# Patient Record
Sex: Male | Born: 1970 | Race: Black or African American | Hispanic: No | Marital: Single | Smoking: Current every day smoker
Health system: Southern US, Community
[De-identification: ages and names within clinical notes are randomized; demographics above are authoritative.]

---

## 2002-01-28 ENCOUNTER — Emergency Department (HOSPITAL_COMMUNITY): Admission: EM | Admit: 2002-01-28 | Discharge: 2002-01-28 | Payer: Self-pay | Admitting: Emergency Medicine

## 2002-05-16 ENCOUNTER — Emergency Department (HOSPITAL_COMMUNITY): Admission: EM | Admit: 2002-05-16 | Discharge: 2002-05-16 | Payer: Self-pay | Admitting: Emergency Medicine

## 2003-01-29 ENCOUNTER — Emergency Department (HOSPITAL_COMMUNITY): Admission: EM | Admit: 2003-01-29 | Discharge: 2003-01-29 | Payer: Self-pay | Admitting: Emergency Medicine

## 2003-05-06 ENCOUNTER — Emergency Department (HOSPITAL_COMMUNITY): Admission: EM | Admit: 2003-05-06 | Discharge: 2003-05-06 | Payer: Self-pay | Admitting: Emergency Medicine

## 2004-08-02 ENCOUNTER — Emergency Department (HOSPITAL_COMMUNITY): Admission: EM | Admit: 2004-08-02 | Discharge: 2004-08-02 | Payer: Self-pay | Admitting: *Deleted

## 2004-09-22 ENCOUNTER — Emergency Department (HOSPITAL_COMMUNITY): Admission: EM | Admit: 2004-09-22 | Discharge: 2004-09-22 | Payer: Self-pay | Admitting: Emergency Medicine

## 2004-09-25 ENCOUNTER — Emergency Department (HOSPITAL_COMMUNITY): Admission: EM | Admit: 2004-09-25 | Discharge: 2004-09-25 | Payer: Self-pay | Admitting: Emergency Medicine

## 2004-09-29 ENCOUNTER — Emergency Department (HOSPITAL_COMMUNITY): Admission: EM | Admit: 2004-09-29 | Discharge: 2004-09-29 | Payer: Self-pay | Admitting: Emergency Medicine

## 2004-10-04 ENCOUNTER — Emergency Department (HOSPITAL_COMMUNITY): Admission: EM | Admit: 2004-10-04 | Discharge: 2004-10-04 | Payer: Self-pay | Admitting: Emergency Medicine

## 2004-10-11 ENCOUNTER — Ambulatory Visit: Payer: Self-pay | Admitting: Internal Medicine

## 2004-10-12 ENCOUNTER — Inpatient Hospital Stay (HOSPITAL_COMMUNITY): Admission: EM | Admit: 2004-10-12 | Discharge: 2004-10-16 | Payer: Self-pay | Admitting: Emergency Medicine

## 2004-11-09 ENCOUNTER — Ambulatory Visit: Payer: Self-pay | Admitting: Internal Medicine

## 2004-12-02 ENCOUNTER — Ambulatory Visit: Payer: Self-pay | Admitting: Internal Medicine

## 2004-12-07 ENCOUNTER — Ambulatory Visit: Payer: Self-pay | Admitting: *Deleted

## 2007-12-07 ENCOUNTER — Ambulatory Visit: Payer: Self-pay | Admitting: Internal Medicine

## 2007-12-07 DIAGNOSIS — K089 Disorder of teeth and supporting structures, unspecified: Secondary | ICD-10-CM | POA: Insufficient documentation

## 2007-12-07 LAB — CONVERTED CEMR LAB
BUN: 11 mg/dL (ref 6–23)
CO2: 26 meq/L (ref 19–32)
Chloride: 108 meq/L (ref 96–112)
Glucose, Bld: 90 mg/dL (ref 70–99)
Potassium: 4.5 meq/L (ref 3.5–5.3)
Sodium: 142 meq/L (ref 135–145)

## 2008-04-11 ENCOUNTER — Ambulatory Visit: Payer: Self-pay | Admitting: Internal Medicine

## 2008-04-15 ENCOUNTER — Encounter (INDEPENDENT_AMBULATORY_CARE_PROVIDER_SITE_OTHER): Payer: Self-pay | Admitting: Internal Medicine

## 2008-06-10 ENCOUNTER — Emergency Department (HOSPITAL_COMMUNITY): Admission: EM | Admit: 2008-06-10 | Discharge: 2008-06-10 | Payer: Self-pay | Admitting: Emergency Medicine

## 2009-04-05 ENCOUNTER — Emergency Department (HOSPITAL_COMMUNITY): Admission: EM | Admit: 2009-04-05 | Discharge: 2009-04-06 | Payer: Self-pay | Admitting: Emergency Medicine

## 2009-04-10 ENCOUNTER — Encounter (INDEPENDENT_AMBULATORY_CARE_PROVIDER_SITE_OTHER): Payer: Self-pay | Admitting: Internal Medicine

## 2009-04-15 ENCOUNTER — Ambulatory Visit (HOSPITAL_COMMUNITY): Admission: RE | Admit: 2009-04-15 | Discharge: 2009-04-15 | Payer: Self-pay | Admitting: Otolaryngology

## 2009-09-15 ENCOUNTER — Emergency Department (HOSPITAL_COMMUNITY): Admission: EM | Admit: 2009-09-15 | Discharge: 2009-09-15 | Payer: Self-pay | Admitting: Emergency Medicine

## 2010-01-07 ENCOUNTER — Emergency Department (HOSPITAL_COMMUNITY): Admission: EM | Admit: 2010-01-07 | Discharge: 2010-01-07 | Payer: Self-pay | Admitting: Emergency Medicine

## 2010-03-04 ENCOUNTER — Ambulatory Visit: Payer: Self-pay | Admitting: Family Medicine

## 2010-03-04 LAB — CONVERTED CEMR LAB
Albumin: 4.8 g/dL (ref 3.5–5.2)
BUN: 13 mg/dL (ref 6–23)
Basophils Absolute: 0 10*3/uL (ref 0.0–0.1)
Calcium: 9.2 mg/dL (ref 8.4–10.5)
Chloride: 105 meq/L (ref 96–112)
Cholesterol: 209 mg/dL — ABNORMAL HIGH (ref 0–200)
Creatinine, Ser: 1.15 mg/dL (ref 0.40–1.50)
Eosinophils Relative: 1 % (ref 0–5)
Glucose, Bld: 92 mg/dL (ref 70–99)
HCT: 43.7 % (ref 39.0–52.0)
HCV Ab: NEGATIVE
HDL: 48 mg/dL (ref >39)
Hemoglobin: 14.3 g/dL (ref 13.0–17.0)
Hep A Total Ab: NEGATIVE
Lymphocytes Relative: 54 % — ABNORMAL HIGH (ref 12–46)
MCHC: 32.7 g/dL (ref 30.0–36.0)
Monocytes Absolute: 0.4 10*3/uL (ref 0.1–1.0)
Monocytes Relative: 8 % (ref 3–12)
Neutro Abs: 1.8 10*3/uL (ref 1.7–7.7)
Potassium: 3.9 meq/L (ref 3.5–5.3)
RBC: 4.44 M/uL (ref 4.22–5.81)
RDW: 13.5 % (ref 11.5–15.5)
Total CHOL/HDL Ratio: 4.4
Triglycerides: 162 mg/dL — ABNORMAL HIGH (ref <150)

## 2010-05-20 ENCOUNTER — Emergency Department (HOSPITAL_COMMUNITY): Admission: EM | Admit: 2010-05-20 | Discharge: 2010-05-20 | Payer: Self-pay | Admitting: Emergency Medicine

## 2010-06-13 ENCOUNTER — Emergency Department (HOSPITAL_COMMUNITY): Admission: EM | Admit: 2010-06-13 | Discharge: 2010-06-13 | Payer: Self-pay | Admitting: Emergency Medicine

## 2010-09-07 NOTE — Letter (Signed)
Summary: [facility]  [facility]   Imported By: Arta Bruce 03/25/2010 12:28:38  _____________________________________________________________________  External Attachment:    Type:   Image     Comment:   External Document

## 2010-10-05 ENCOUNTER — Emergency Department (HOSPITAL_COMMUNITY): Payer: Self-pay

## 2010-10-05 ENCOUNTER — Emergency Department (HOSPITAL_COMMUNITY)
Admission: EM | Admit: 2010-10-05 | Discharge: 2010-10-05 | Disposition: A | Discharge status: Home / Self Care | Payer: Self-pay | Attending: Emergency Medicine | Admitting: Emergency Medicine

## 2010-10-05 DIAGNOSIS — N509 Disorder of male genital organs, unspecified: Secondary | ICD-10-CM | POA: Insufficient documentation

## 2010-10-05 DIAGNOSIS — R1032 Left lower quadrant pain: Secondary | ICD-10-CM | POA: Insufficient documentation

## 2010-10-05 LAB — CBC
Hemoglobin: 14 g/dL (ref 13.0–17.0)
MCH: 32 pg (ref 26.0–34.0)
MCV: 96.8 fL (ref 78.0–100.0)
RBC: 4.37 MIL/uL (ref 4.22–5.81)

## 2010-10-05 LAB — DIFFERENTIAL
Eosinophils Absolute: 0 10*3/uL (ref 0.0–0.7)
Lymphs Abs: 2.9 10*3/uL (ref 0.7–4.0)
Monocytes Absolute: 0.3 10*3/uL (ref 0.1–1.0)
Monocytes Relative: 6 % (ref 3–12)
Neutro Abs: 2.2 10*3/uL (ref 1.7–7.7)
Neutrophils Relative %: 41 % — ABNORMAL LOW (ref 43–77)

## 2010-10-05 LAB — POCT I-STAT, CHEM 8
BUN: 8 mg/dL (ref 6–23)
Calcium, Ion: 1.06 mmol/L — ABNORMAL LOW (ref 1.12–1.32)
Chloride: 108 mEq/L (ref 96–112)
Creatinine, Ser: 1.1 mg/dL (ref 0.4–1.5)

## 2010-10-05 LAB — URINALYSIS, ROUTINE W REFLEX MICROSCOPIC
Hgb urine dipstick: NEGATIVE
Ketones, ur: 15 mg/dL — AB
Urine Glucose, Fasting: NEGATIVE mg/dL
pH: 7.5 (ref 5.0–8.0)

## 2010-11-12 LAB — CBC
HCT: 43 % (ref 39.0–52.0)
MCV: 100 fL (ref 78.0–100.0)
Platelets: 241 10*3/uL (ref 150–400)
RDW: 12.3 % (ref 11.5–15.5)

## 2010-11-13 LAB — BASIC METABOLIC PANEL
CO2: 28 mEq/L (ref 19–32)
Chloride: 104 mEq/L (ref 96–112)
GFR calc Af Amer: >60 mL/min (ref >60)
Potassium: 3.7 mEq/L (ref 3.5–5.1)
Sodium: 139 mEq/L (ref 135–145)

## 2010-11-13 LAB — DIFFERENTIAL
Eosinophils Absolute: 0 10*3/uL (ref 0.0–0.7)
Lymphocytes Relative: 15 % (ref 12–46)
Lymphs Abs: 1.6 10*3/uL (ref 0.7–4.0)
Neutro Abs: 8.9 10*3/uL — ABNORMAL HIGH (ref 1.7–7.7)
Neutrophils Relative %: 80 % — ABNORMAL HIGH (ref 43–77)

## 2010-11-13 LAB — PROTIME-INR
INR: 1.1 (ref 0.00–1.49)
Prothrombin Time: 13.6 seconds (ref 11.6–15.2)

## 2010-11-13 LAB — SAMPLE TO BLOOD BANK

## 2010-11-13 LAB — APTT: aPTT: 21 seconds — ABNORMAL LOW (ref 24–37)

## 2010-11-13 LAB — CBC
Platelets: 223 10*3/uL (ref 150–400)
WBC: 11.1 10*3/uL — ABNORMAL HIGH (ref 4.0–10.5)
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
## 2010-12-24 NOTE — Discharge Summary (Signed)
NAMEAUDEL, Brady Braun NO.:  [id number]   MEDICAL RECORD NO.:  [id number]          PATIENT TYPE:  INP   LOCATION:  5040                         FACILITY:  [facility]   PHYSICIAN:  Elliot Cousin, M.D.    DATE OF BIRTH:  [birth year]   DATE OF ADMISSION:  10/11/2004  DATE OF DISCHARGE:  10/16/2004                                 DISCHARGE SUMMARY   DISCHARGE DIAGNOSES:  1.  Large left posterolateral/foraminal cervical vertebrae-6/7 disc      protrusion with associated mass-effect. Small to slightly moderate-sized      broad-based right posterolateral cervical vertebrae-5/5 disc protrusion      per MRI of the cervical spine on February27,2006.  2.  Status post cervical vertebrae-6/7 anterior cervical decompression and      fusion with reflux hybrid plate per Dr. Trey Sailors on [phone].   DISCHARGE MEDICATIONS:  Percocet 1-2 tablets every 6 hours as needed for  pain.   DISCHARGE DISPOSITION:  The patient was discharged to home in improved and  stable condition on March11,2006. He was advised to follow up with Dr. Channing Mutters  in two weeks for hospital follow-up.   HISTORY OF PRESENT ILLNESS:  The patient is a 40 year old man with no  significant past medical history who presented to the emergency department  with a chief complaint of worsening pain in his left arm, left neck and  chest. The pain had been accompanied by worsening numbness, tingling and  weakness in his left arm. The patient had a several month history of pain in  his left arm, neck and chest. He was evaluated in the emergency department  on multiple occasions. Finally on February27,2006, when the patient  presented to the emergency department again, a MRI of the cervical spine was  ordered. The MRI revealed a large left posterior C6-C7 disc protrusion and a  small right C5-C6 protrusion. The patient was referred to a neurosurgeon as  an outpatient from the emergency department. However, when he presented to  the [facility], the physician at the [facility]  referred the patient to the emergency department for further evaluation and  management of severe pain in his left arm, neck and chest.   HOSPITAL COURSE:  The patient was started  a morphine PCA pump for pain  control. There was a concern regarding the area of tenderness and edema over  the left chest wall. A CT scan of the chest was ordered to rule out a mass.  The CT scan of the chest which was performed on March7,2006 was negative.  The CT scan of the chest was completely normal. Dr. Trey Sailors, neurosurgeon,  was consulted for further evaluation and management. Per Dr. Loraine Leriche Roy's  assessment, the patient would benefit from an anterior cervical discectomy  and fusion. Prior to the operation, the patient had an episode when his legs  gave out when he was walking. The patient fell to the floor however, his  fall was braced by his sister. The patient had no loss of consciousness.  There was no trauma to his  neck or his head. The patient had been advised  previously to not leave the floor ambulating independently. However, he  attempted to go to the cafeteria, and when he did, his legs gave way. The  patient was returned to his room in a stretcher with no further episodes.   On March10,2006, the patient was taken to the operating room where he  underwent a C6-C7 anterior cervical decompression and fusion per Dr. Trey Sailors. The patient tolerated the procedure well. A follow-up x-ray of the  cervical spine revealed an anterior cervical fusion at C6-C7 level with  normal alignment to the C7 level.   The patient was discharged to home by the neurosurgery team on March11,2006  in no acute distress.   DISCHARGE LABORATORY DATA:  Sodium 137, potassium 4.1, chloride 112, CO2 23,  glucose 92, BUN 9, creatinine 1.0, total bilirubin 0.7, alkaline phosphatase  42, AST 18, ALT 25, total protein 6.4, albumin 3.7, calcium 8.5. WBC  4.8,  hemoglobin 14.3, hematocrit 42.9, platelets 303,000 (these labs were drawn  on March9,2006).      DF/MEDQ  D:  10/20/2004  T:  10/20/2004  Job:  [id number]   cc:   Payton Doughty, M.D.  [address]  [place]  [place] [address]  Fax: [phone]

## 2010-12-24 NOTE — Op Note (Signed)
Brady Braun, Brady Braun NO.:  [id number]   MEDICAL RECORD NO.:  [id number]          PATIENT TYPE:  INP   LOCATION:                               FACILITY:  [facility]   PHYSICIAN:  Payton Doughty, M.D.      DATE OF BIRTH:  Jul 22, 1971   DATE OF PROCEDURE:  10/15/2004  DATE OF DISCHARGE:                                 OPERATIVE REPORT   PREOPERATIVE DIAGNOSIS:  Herniated disk at C6-7 on the left.   POSTOPERATIVE DIAGNOSIS:  Herniated disk at C6-7 on the left.   OPERATIVE PROCEDURE:  C6-7 anterior cervical decompression and fusion with  reflux hybrid plate.   SURGEON:  Payton Doughty, M.D.   ANESTHESIA:  General endotracheal.   PREPARATION:  Prepped with standard prep and scrubbed with alcohol wipe.   COMPLICATIONS:  None.   INDICATIONS:  This is a 40 year old, right-handed, black gentleman with a  herniated disk at C6-7 on the left side.   DESCRIPTION OF PROCEDURE:  He was taken to the operating room, smoothly  anesthetized, intubated and placed supine on the operating table in the  halter head traction with the neck extended.  Following shave, prep and  drape in the usual sterile fashion, the skin was incised in the midline to  the medial border of the sternocleidomastoid muscle on the left side.  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  The  sternocleidomastoid was identified and medial dissection revealed the  carotid artery tracked laterally to the left and the trachea and esophagus  retracted laterally to the right.  Exposing the bones of the anterior  cervical spine, markers were placed.  Intraoperative x-ray obtained to  confirm the correctness of the level.  Having confirmed correctness of the  level, diskectomy was carried out at C6-7 under gross observation.  The  operating microscope was then brought in and we used the microdissection  technique to remove the remaining disk and dissect the anterior epidural  space, which was found  to be full of herniated disk that extended out into  the left C7 neural foramen.  This was removed without difficulty.  End  plates were curetted.  The nerve roots were carefully explored and found to  be open.  A 7 mm bone graft was fashioned and patellar allograft tapped into  place.  The 14 mm reflex hybrid plate was then placed with two screws in C6  and two in C7.  Intraoperative x-rays showed good placement of bone graft,  plate and screws.  The wound was irrigated and hemostasis was ensured.  The  platysma was reapproximated with 3-0 Vicryl in interrupted fashion.  The  subcutaneous tissues were reapproximated with 3-0 Vicryl interrupted  fashion.  The skin was closed with 4-0 Vicryl in a running subcuticular  fashion.  Benzoin and Steri-Strips were placed and made occlusive with talc  and Op-Site.  Then the patient returned to the recovery room in good  condition.      ___________________________________________  Payton Doughty, M.D.   MWR/MEDQ  D:  10/15/2004  T:  10/16/2004  Job:  [id number]

## 2011-05-10 LAB — POCT I-STAT, CHEM 8
BUN: 8
Calcium, Ion: 1.24
TCO2: 28

## 2011-07-01 ENCOUNTER — Emergency Department (HOSPITAL_COMMUNITY)
Admission: EM | Admit: 2011-07-01 | Discharge: 2011-07-01 | Disposition: A | Discharge status: Home / Self Care | Payer: Self-pay | Attending: Emergency Medicine | Admitting: Emergency Medicine

## 2011-07-01 ENCOUNTER — Emergency Department (HOSPITAL_COMMUNITY): Payer: Self-pay

## 2011-07-01 DIAGNOSIS — S61209A Unspecified open wound of unspecified finger without damage to nail, initial encounter: Secondary | ICD-10-CM | POA: Insufficient documentation

## 2011-07-01 DIAGNOSIS — W260XXA Contact with knife, initial encounter: Secondary | ICD-10-CM | POA: Insufficient documentation

## 2011-07-01 DIAGNOSIS — IMO0002 Reserved for concepts with insufficient information to code with codable children: Secondary | ICD-10-CM

## 2011-07-01 DIAGNOSIS — T148XXA Other injury of unspecified body region, initial encounter: Secondary | ICD-10-CM

## 2011-07-01 MED ORDER — OXYCODONE-ACETAMINOPHEN 5-325 MG PO TABS
2.0000 | ORAL_TABLET | Freq: Once | ORAL | Status: AC
  Administered 2011-07-01: 2 via ORAL
  Filled 2011-07-01: qty 2

## 2011-07-01 MED ORDER — BUPIVACAINE HCL (PF) 0.25 % IJ SOLN
INTRAMUSCULAR | Status: AC
  Filled 2011-07-01: qty 30

## 2011-07-01 MED ORDER — HYDROCODONE-ACETAMINOPHEN 5-500 MG PO TABS: 1.0000 | ORAL_TABLET | Freq: Four times a day (QID) | ORAL | Status: AC | PRN

## 2011-07-01 MED ORDER — BUPIVACAINE HCL (PF) 0.5 % IJ SOLN
INTRAMUSCULAR | Status: AC
  Administered 2011-07-01: 12:00:00
  Filled 2011-07-01: qty 30

## 2011-07-01 NOTE — ED Provider Notes (Signed)
History     CSN: [id number] Arrival date & time: 07/01/2011 10:42 AM   None     Chief Complaint  Patient presents with  . Laceration    (Consider location/radiation/quality/duration/timing/severity/associated sxs/prior treatment) HPI  Patient presents to emergency department complaining of right thumb injury just prior to arrival when he states he sliced the tip of his right thumb on a sharp meet cutter. Patient states she's been controlling the bleeding with direct pressure. He denies thumn numbness or tingling but states constant pain since onset of laceration. Has not taken anything for pain prior to arrival. Patient states his last tetanus was within the last 2-3 years. Denies pain in proxiamal thumb or hand. He denies additional injury. Patient has no known medical problems and takes no medicines on a regular basis. Pain is aggravated by touch of the distal tip of thumb.  No past medical history on file.  No past surgical history on file.  No family history on file.  History  Substance Use Topics  . Smoking status: Not on file  . Smokeless tobacco: Not on file  . Alcohol Use: Not on file      Review of Systems  All other systems reviewed and are negative.    Allergies  Review of patient's allergies indicates no known allergies.  Home Medications  No current outpatient prescriptions on file.  BP 106/74  Pulse 79  Temp(Src) 98.6 F (37 C) (Oral)  Resp 18  Ht 5\' 6"  (1.676 m)  Wt 130 lb (58.968 kg)  BMI 20.98 kg/m2  SpO2 100%  Physical Exam  Nursing note and vitals reviewed. Constitutional: He is oriented to person, place, and time. He appears well-developed and well-nourished.  HENT:  Head: Normocephalic and atraumatic.  Eyes: Conjunctivae are normal.  Neck: Normal range of motion.  Cardiovascular: Normal rate.   Pulmonary/Chest: Effort normal.  Musculoskeletal: Normal range of motion. He exhibits no edema.       Right shoulder: He exhibits  laceration.       Right hand: He exhibits tenderness and laceration. He exhibits no bony tenderness and normal capillary refill. normal sensation noted. Normal strength noted.       Hands:      Soft tissue avulation/injury of lateral edge of right distal lateral thumb and lateral edge of thumbnail but No obvious bone injury. Full range of motion of thumb against resistance with mild pain and distal tip. No tenderness to palpation of distal and proximal joints of the thumb  Neurological: He is alert and oriented to person, place, and time.    ED Course  NERVE BLOCK Date/Time: 07/01/2011 12:11 PM Performed by: Jenness Corner Authorized by: Jenness Corner Consent: Verbal consent obtained. Written consent not obtained. Risks and benefits: risks, benefits and alternatives were discussed Consent given by: patient Patient understanding: patient states understanding of the procedure being performed Patient consent: the patient's understanding of the procedure matches consent given Patient identity confirmed: verbally with patient and arm band Time out: Immediately prior to procedure a "time out" was called to verify the correct patient, procedure, equipment, support staff and site/side marked as required. Indications: pain relief Nerve block body site: right thumb. Preparation: Patient was prepped and draped in the usual sterile fashion. Local anesthetic: bupivacaine 0.5% without epinephrine Anesthetic total: 6 ml Outcome: pain improved Patient tolerance: Patient tolerated the procedure well with no immediate complications.   (including critical care time)  PO percocet  Labs Reviewed - No data to display  Dg Finger Thumb Right  07/01/2011  *RADIOLOGY REPORT*  Clinical Data: Right thumb injury question of prior fracture  RIGHT THUMB 2+V  Comparison: None.  Findings: Three views of the right thumb submitted.  Study is limited by bandage artifact.  No acute fracture or subluxation. Soft  tissue irregularity noted at the tip of the thumb bowel.  IMPRESSION: No acute fracture or subluxation.  Bandage artifacts are noted. Soft tissue injury at the tip of the thumb .  Original Report Authenticated By: Natasha Mead, M.D.     1. Soft tissue avulsion   2. Laceration       MDM  Soft tissue laceration/avulsion of right distal thumb and fingernail but no bone involvement. No obvious nail bed involvement. Good cap refill and sensation. No tenderness to palpation of distal or proximal joints. No other injury noted. Patient agreeable to following up with hand surgery for further evaluation        Jenness Corner, PA 07/01/11 [phone]

## 2011-07-01 NOTE — ED Notes (Signed)
Patient denies pain and is resting comfortably.  Wound care completed . Supplies and instructions giiven

## 2011-07-01 NOTE — ED Provider Notes (Signed)
Evaluation and management procedures were performed by the mid-level provider (PA/NP/CNM) under my supervision/collaboration. I was present and available during the ED course. Megen Madewell Y.   Bennet Kujawa Y. Anirudh Baiz, MD 07/01/11 1522 
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
## 2011-07-01 NOTE — ED Notes (Signed)
Pt states he had t-dap 2years ago

## 2011-11-16 ENCOUNTER — Ambulatory Visit (HOSPITAL_COMMUNITY)
Admission: EM | Admit: 2011-11-16 | Discharge: 2011-11-17 | Disposition: A | Discharge status: Home / Self Care | Payer: Self-pay | Attending: Emergency Medicine | Admitting: Emergency Medicine

## 2011-11-16 ENCOUNTER — Inpatient Hospital Stay: Admit: 2011-11-16 | Payer: Self-pay | Admitting: Urology

## 2011-11-16 ENCOUNTER — Encounter (HOSPITAL_COMMUNITY)
Admission: EM | Disposition: A | Discharge status: Home / Self Care | Payer: Self-pay | Source: Home / Self Care | Attending: Emergency Medicine

## 2011-11-16 ENCOUNTER — Encounter (HOSPITAL_COMMUNITY): Payer: Self-pay | Admitting: Anesthesiology

## 2011-11-16 ENCOUNTER — Emergency Department (HOSPITAL_COMMUNITY): Payer: Self-pay | Admitting: Anesthesiology

## 2011-11-16 ENCOUNTER — Encounter (HOSPITAL_COMMUNITY): Payer: Self-pay | Admitting: *Deleted

## 2011-11-16 ENCOUNTER — Other Ambulatory Visit: Payer: Self-pay | Admitting: Urology

## 2011-11-16 DIAGNOSIS — IMO0002 Reserved for concepts with insufficient information to code with codable children: Secondary | ICD-10-CM | POA: Insufficient documentation

## 2011-11-16 DIAGNOSIS — F17200 Nicotine dependence, unspecified, uncomplicated: Secondary | ICD-10-CM | POA: Insufficient documentation

## 2011-11-16 DIAGNOSIS — S39840A Fracture of corpus cavernosum penis, initial encounter: Secondary | ICD-10-CM | POA: Insufficient documentation

## 2011-11-16 DIAGNOSIS — Y9389 Activity, other specified: Secondary | ICD-10-CM | POA: Insufficient documentation

## 2011-11-16 HISTORY — PX: CYSTOSCOPY: SHX5120

## 2011-11-16 LAB — CBC
MCH: 32.9 pg (ref 26.0–34.0)
MCHC: 34.3 g/dL (ref 30.0–36.0)
Platelets: 215 10*3/uL (ref 150–400)
RBC: 4.07 MIL/uL — ABNORMAL LOW (ref 4.22–5.81)

## 2011-11-16 LAB — BASIC METABOLIC PANEL
Calcium: 8.9 mg/dL (ref 8.4–10.5)
GFR calc non Af Amer: >90 mL/min (ref >90)
Glucose, Bld: 96 mg/dL (ref 70–99)
Sodium: 145 mEq/L (ref 135–145)

## 2011-11-16 LAB — DIFFERENTIAL
Basophils Relative: 1 % (ref 0–1)
Eosinophils Absolute: 0 10*3/uL (ref 0.0–0.7)
Lymphs Abs: 2.8 10*3/uL (ref 0.7–4.0)
Neutrophils Relative %: 45 % (ref 43–77)

## 2011-11-16 SURGERY — CYSTOSCOPY, FLEXIBLE
Anesthesia: General | Site: Urethra | Wound class: Clean Contaminated

## 2011-11-16 MED ORDER — BUPIVACAINE-EPINEPHRINE PF 0.25-1:200000 % IJ SOLN
INTRAMUSCULAR | Status: AC
  Filled 2011-11-16: qty 30

## 2011-11-16 MED ORDER — HYDROMORPHONE HCL PF 1 MG/ML IJ SOLN
0.5000 mg | INTRAMUSCULAR | Status: DC | PRN
  Administered 2011-11-16 – 2011-11-17 (×6): 1 mg via INTRAVENOUS
  Filled 2011-11-16 (×6): qty 1

## 2011-11-16 MED ORDER — HYDROCODONE-ACETAMINOPHEN 5-325 MG PO TABS
1.0000 | ORAL_TABLET | ORAL | Status: DC | PRN
  Administered 2011-11-17 (×2): 2 via ORAL
  Filled 2011-11-16 (×2): qty 2

## 2011-11-16 MED ORDER — MIDAZOLAM HCL 5 MG/5ML IJ SOLN
INTRAMUSCULAR | Status: DC | PRN
  Administered 2011-11-16: 2 mg via INTRAVENOUS

## 2011-11-16 MED ORDER — DOCUSATE SODIUM 100 MG PO CAPS
100.0000 mg | ORAL_CAPSULE | Freq: Two times a day (BID) | ORAL | Status: DC
  Administered 2011-11-16 – 2011-11-17 (×2): 100 mg via ORAL
  Filled 2011-11-16 (×3): qty 1

## 2011-11-16 MED ORDER — HYDROMORPHONE HCL PF 1 MG/ML IJ SOLN
INTRAMUSCULAR | Status: AC
  Filled 2011-11-16: qty 1

## 2011-11-16 MED ORDER — ONDANSETRON HCL 4 MG/2ML IJ SOLN
INTRAMUSCULAR | Status: DC | PRN
  Administered 2011-11-16: 4 mg via INTRAVENOUS

## 2011-11-16 MED ORDER — CEFAZOLIN SODIUM 1-5 GM-% IV SOLN
1.0000 g | INTRAVENOUS | Status: AC
  Administered 2011-11-16: 1 g via INTRAVENOUS
  Filled 2011-11-16: qty 50

## 2011-11-16 MED ORDER — LACTATED RINGERS IV SOLN
INTRAVENOUS | Status: DC | PRN
  Administered 2011-11-16: 16:00:00 via INTRAVENOUS

## 2011-11-16 MED ORDER — CEFAZOLIN SODIUM 1-5 GM-% IV SOLN
1.0000 g | Freq: Three times a day (TID) | INTRAVENOUS | Status: AC
  Administered 2011-11-16 – 2011-11-17 (×2): 1 g via INTRAVENOUS
  Filled 2011-11-16 (×2): qty 50

## 2011-11-16 MED ORDER — SODIUM CHLORIDE 0.9 % IV SOLN: Freq: Once | INTRAVENOUS | Status: DC

## 2011-11-16 MED ORDER — HYDROMORPHONE HCL PF 1 MG/ML IJ SOLN
0.2500 mg | INTRAMUSCULAR | Status: DC | PRN
Start: 2011-11-16 — End: 2011-11-16
  Administered 2011-11-16 (×4): 0.5 mg via INTRAVENOUS

## 2011-11-16 MED ORDER — ONDANSETRON HCL 4 MG/2ML IJ SOLN
4.0000 mg | INTRAMUSCULAR | Status: DC | PRN
  Administered 2011-11-16: 4 mg via INTRAVENOUS
  Filled 2011-11-16: qty 2

## 2011-11-16 MED ORDER — LACTATED RINGERS IV SOLN: INTRAVENOUS | Status: DC

## 2011-11-16 MED ORDER — LIDOCAINE HCL (CARDIAC) 20 MG/ML IV SOLN
INTRAVENOUS | Status: DC | PRN
  Administered 2011-11-16: 50 mg via INTRAVENOUS

## 2011-11-16 MED ORDER — CEFAZOLIN SODIUM 1-5 GM-% IV SOLN
INTRAVENOUS | Status: AC
  Filled 2011-11-16: qty 50

## 2011-11-16 MED ORDER — MEPERIDINE HCL 50 MG/ML IJ SOLN
INTRAMUSCULAR | Status: AC
  Filled 2011-11-16: qty 1

## 2011-11-16 MED ORDER — SODIUM CHLORIDE 0.9 % IJ SOLN
INTRAMUSCULAR | Status: DC | PRN
  Administered 2011-11-16: 30 mL

## 2011-11-16 MED ORDER — MORPHINE SULFATE 2 MG/ML IJ SOLN
INTRAMUSCULAR | Status: AC
  Administered 2011-11-16: 2 mg via INTRAVENOUS
  Filled 2011-11-16: qty 1

## 2011-11-16 MED ORDER — ACETAMINOPHEN 10 MG/ML IV SOLN
INTRAVENOUS | Status: DC | PRN
  Administered 2011-11-16: 1000 mg via INTRAVENOUS

## 2011-11-16 MED ORDER — LACTATED RINGERS IV SOLN
INTRAVENOUS | Status: DC
  Administered 2011-11-16: 18:00:00 via INTRAVENOUS

## 2011-11-16 MED ORDER — OXYCODONE-ACETAMINOPHEN 5-325 MG PO TABS
2.0000 | ORAL_TABLET | Freq: Once | ORAL | Status: AC
  Administered 2011-11-16: 2 via ORAL
  Filled 2011-11-16: qty 2

## 2011-11-16 MED ORDER — BACITRACIN-NEOMYCIN-POLYMYXIN 400-5-5000 EX OINT: 1.0000 "application " | TOPICAL_OINTMENT | Freq: Three times a day (TID) | CUTANEOUS | Status: DC | PRN

## 2011-11-16 MED ORDER — PROMETHAZINE HCL 25 MG/ML IJ SOLN: 6.2500 mg | INTRAMUSCULAR | Status: DC | PRN

## 2011-11-16 MED ORDER — STERILE WATER FOR IRRIGATION IR SOLN
Status: DC | PRN
  Administered 2011-11-16: 3000 mL via INTRAVESICAL

## 2011-11-16 MED ORDER — FENTANYL CITRATE 0.05 MG/ML IJ SOLN
INTRAMUSCULAR | Status: DC | PRN
  Administered 2011-11-16 (×2): 25 ug via INTRAVENOUS
  Administered 2011-11-16: 100 ug via INTRAVENOUS
  Administered 2011-11-16 (×2): 50 ug via INTRAVENOUS

## 2011-11-16 MED ORDER — ACETAMINOPHEN 325 MG PO TABS: 650.0000 mg | ORAL_TABLET | ORAL | Status: DC | PRN

## 2011-11-16 MED ORDER — MEPERIDINE HCL 50 MG/ML IJ SOLN: 6.2500 mg | INTRAMUSCULAR | Status: DC | PRN

## 2011-11-16 MED ORDER — ACETAMINOPHEN 10 MG/ML IV SOLN
INTRAVENOUS | Status: AC
  Filled 2011-11-16: qty 100

## 2011-11-16 MED ORDER — 0.9 % SODIUM CHLORIDE (POUR BTL) OPTIME
TOPICAL | Status: DC | PRN
  Administered 2011-11-16: 1000 mL

## 2011-11-16 MED ORDER — PROPOFOL 10 MG/ML IV BOLUS
INTRAVENOUS | Status: DC | PRN
  Administered 2011-11-16: 200 mg via INTRAVENOUS

## 2011-11-16 MED ORDER — MORPHINE SULFATE 2 MG/ML IJ SOLN
2.0000 mg | INTRAMUSCULAR | Status: DC | PRN
  Administered 2011-11-16: 2 mg via INTRAVENOUS

## 2011-11-16 MED ORDER — ZOLPIDEM TARTRATE 5 MG PO TABS: 5.0000 mg | ORAL_TABLET | Freq: Every evening | ORAL | Status: DC | PRN

## 2011-11-16 SURGICAL SUPPLY — 38 items
BAG URINE LEG 500ML (DRAIN) ×1 IMPLANT
BAG URO CATCHER STRL LF (DRAPE) ×2 IMPLANT
BANDAGE CONFORM 2  STR LF (GAUZE/BANDAGES/DRESSINGS) ×1 IMPLANT
BLADE SURG 15 STRL LF DISP TIS (BLADE) ×2 IMPLANT
BLADE SURG 15 STRL SS (BLADE) ×3
BNDG COHESIVE 1X5 TAN STRL LF (GAUZE/BANDAGES/DRESSINGS) ×3 IMPLANT
CATH FOLEY 2WAY SLVR  5CC 16FR (CATHETERS) ×1
CATH FOLEY 2WAY SLVR 5CC 16FR (CATHETERS) IMPLANT
CLOTH BEACON ORANGE TIMEOUT ST (SAFETY) ×3 IMPLANT
COVER SURGICAL LIGHT HANDLE (MISCELLANEOUS) ×3 IMPLANT
DRAIN PENROSE 18X1/4 LTX STRL (WOUND CARE) ×1 IMPLANT
DRAPE CAMERA CLOSED 9X96 (DRAPES) ×3 IMPLANT
DRAPE LAPAROSCOPIC ABDOMINAL (DRAPES) ×1 IMPLANT
ELECT REM PT RETURN 9FT ADLT (ELECTROSURGICAL) ×3
ELECTRODE REM PT RTRN 9FT ADLT (ELECTROSURGICAL) ×2 IMPLANT
GAUZE VASELINE 1X8 (GAUZE/BANDAGES/DRESSINGS) ×3 IMPLANT
GAUZE XEROFORM 1X8 LF (GAUZE/BANDAGES/DRESSINGS) ×1 IMPLANT
GLOVE BIOGEL M STRL SZ7.5 (GLOVE) ×3 IMPLANT
GOWN PREVENTION PLUS XLARGE (GOWN DISPOSABLE) ×3 IMPLANT
GOWN STRL NON-REIN LRG LVL3 (GOWN DISPOSABLE) ×3 IMPLANT
GOWN STRL REIN XL XLG (GOWN DISPOSABLE) ×3 IMPLANT
IV SET HUBERPLUS 22X1 SAFETY (NEEDLE) ×1 IMPLANT
KIT BASIN OR (CUSTOM PROCEDURE TRAY) ×3 IMPLANT
MANIFOLD NEPTUNE II (INSTRUMENTS) ×2 IMPLANT
NS IRRIG 1000ML POUR BTL (IV SOLUTION) IMPLANT
PACK BASIC VI WITH GOWN DISP (CUSTOM PROCEDURE TRAY) ×2 IMPLANT
PACK CYSTO (CUSTOM PROCEDURE TRAY) ×2 IMPLANT
PACK GENERAL/GYN (CUSTOM PROCEDURE TRAY) ×1 IMPLANT
PENCIL BUTTON HOLSTER BLD 10FT (ELECTRODE) ×3 IMPLANT
SET IRRIG Y TYPE TUR BLADDER L (SET/KITS/TRAYS/PACK) ×1 IMPLANT
SPONGE LAP 18X18 X RAY DECT (DISPOSABLE) ×3 IMPLANT
SUT CHROMIC 3 0 PS 2 (SUTURE) IMPLANT
SUT CHROMIC 3 0 SH 27 (SUTURE) ×7 IMPLANT
SUT SILK 2 0 (SUTURE)
SUT SILK 2-0 18XBRD TIE 12 (SUTURE) IMPLANT
SYR 20CC LL (SYRINGE) ×1 IMPLANT
SYR CONTROL 10ML LL (SYRINGE) IMPLANT
TAPE CLOTH SOFT 2X10 (GAUZE/BANDAGES/DRESSINGS) ×1 IMPLANT

## 2011-11-16 NOTE — Brief Op Note (Signed)
11/16/2011  6:57 PM  PATIENT:  Brady Braun  41 y.o. male  PRE-OPERATIVE DIAGNOSIS:  penile fracture, weak stream  POST-OPERATIVE DIAGNOSIS:  penile fracture  PROCEDURE:  Procedure(s) (LRB): CYSTOSCOPY FLEXIBLE (N/A) REPAIR OF FRACTURED PENIS (N/A)  SURGEON:  Surgeon(s) and Role:    * Antony Haste, MD - Primary  ANESTHESIA:   general  FINDINGS: 1 - 1.5 CM tear in the right proximal inferior corpora extending from lateral to medial and adjacent to the urethra.    EBL: 30 ML  BLOOD ADMINISTERED:none  DRAINS: Urinary Catheter (Foley)   LOCAL MEDICATIONS USED:  NONE  SPECIMEN:  No Specimen  DISPOSITION OF SPECIMEN:  N/A  COUNTS:  YES  DICTATION: 161096  PLAN OF CARE: Admit for overnight observation  PATIENT DISPOSITION:  PACU - hemodynamically stable.   Delay start of Pharmacological VTE agent (>24hrs) due to surgical blood loss or risk of bleeding: not applicable

## 2011-11-16 NOTE — Transfer of Care (Signed)
Immediate Anesthesia Transfer of Care Note  Patient: Brady Braun  Procedure(s) Performed: Procedure(s) (LRB): CYSTOSCOPY FLEXIBLE (N/A) REPAIR OF FRACTURED PENIS (N/A)  Patient Location: PACU  Anesthesia Type: General  Level of Consciousness: awake, alert  and oriented  Airway & Oxygen Therapy: Patient Spontanous Breathing and Patient connected to face mask oxygen  Post-op Assessment: Report given to PACU RN and Post -op Vital signs reviewed and stable  Post vital signs: Reviewed and stable  Complications: No apparent anesthesia complications

## 2011-11-16 NOTE — ED Notes (Signed)
Pt's gray shirt and cell phone was to be sent with pt's family, however family was not in waiting room upon pt being taken to OR. Belongings bag with cell phone and shirt sent by security to OR.
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
## 2011-11-16 NOTE — H&P (Addendum)
H&P  Chief Complaint: penile pain and swelling  History of Present Illness: 41 year old African American male he was having sex this morning "doggy style". At approximately 8 AM he missed the vagina and ran the penis into the pubic bone. He felt a pop and had immediate pain. He had swelling of the penis. He continues to have penile pain. He has felt the need to urinate and but could not. However he just voided 150 mL of clear urine. He has not had anything to eat or drink since early this morning.  He has no urologic history apart from cutting his scrotum on a fan as a child. He's had no dysuria or hematuria. He has had no trouble getting her maintaining an erection.  History reviewed. No pertinent past medical history. History reviewed. No pertinent past surgical history.  Home Medications:   (Not in a hospital admission) Allergies: No Known Allergies  History reviewed. No pertinent family history. Social History:  reports that he has been smoking Cigarettes.  He does not have any smokeless tobacco history on file. He reports that he drinks alcohol. He reports that he does not use illicit drugs.  ROS: A complete review of systems was performed.  All systems are negative except for pertinent findings as noted. @ROS @  Physical Exam:  Vital signs in last 24 hours: Temp:  [97.6 F (36.4 C)-98.8 F (37.1 C)] 98.8 F (37.1 C) (04/10 1404) Pulse Rate:  [62-80] 62  (04/10 1404) Resp:  [18-20] 18  (04/10 1404) BP: (107-114)/(61-75) 114/61 mmHg (04/10 1404) SpO2:  [98 %-100 %] 100 % (04/10 1404) General:  Alert and oriented, No acute distress HEENT: Normocephalic, atraumatic Neck: No JVD or lymphadenopathy Cardiovascular: Regular rate and rhythm Lungs: Clear bilaterally Abdomen: Soft, nontender, nondistended, no abdominal masses Back: No CVA tenderness Genitourinary:  The penis and suprapubic area is swollen consistent with a palpable hematoma. And/or edema. The patient has pain on  palpation of the penis. The pain and hematoma seemed to be centered around the right proximal corpora. The urethral meatus appears normal without discharge or blood. There is a mild amount of swelling extending down into the right hemiscrotum. The testes are descended bilaterally and non-tender and without masses, scrotum is normal in appearance without lesions or masses, perineum is normal on inspection. Extremities: No edema Neurologic: Grossly intact  Laboratory Data:  Results for orders placed during the hospital encounter of 11/16/11 (from the past 24 hour(s))  BASIC METABOLIC PANEL     Status: Normal   Collection Time   11/16/11 12:45 PM      Component Value Range   Sodium 145  135 - 145 (mEq/L)   Potassium 4.7  3.5 - 5.1 (mEq/L)   Chloride 110  96 - 112 (mEq/L)   CO2 27  19 - 32 (mEq/L)   Glucose, Bld 96  70 - 99 (mg/dL)   BUN 12  6 - 23 (mg/dL)   Creatinine, Ser 5.62  0.50 - 1.35 (mg/dL)   Calcium 8.9  8.4 - 13.0 (mg/dL)   GFR calc non Af Amer >90  >90 (mL/min)   GFR calc Af Amer >90  >90 (mL/min)  CBC     Status: Abnormal   Collection Time   11/16/11 12:45 PM      Component Value Range   WBC 5.9  4.0 - 10.5 (K/uL)   RBC 4.07 (*) 4.22 - 5.81 (MIL/uL)   Hemoglobin 13.4  13.0 - 17.0 (g/dL)   HCT 86.5  78.4 -  52.0 (%)   MCV 96.1  78.0 - 100.0 (fL)   MCH 32.9  26.0 - 34.0 (pg)   MCHC 34.3  30.0 - 36.0 (g/dL)   RDW 21.3  08.6 - 57.8 (%)   Platelets 215  150 - 400 (K/uL)  DIFFERENTIAL     Status: Abnormal   Collection Time   11/16/11 12:45 PM      Component Value Range   Neutrophils Relative 45  43 - 77 (%)   Neutro Abs 2.7  1.7 - 7.7 (K/uL)   Lymphocytes Relative 47 (*) 12 - 46 (%)   Lymphs Abs 2.8  0.7 - 4.0 (K/uL)   Monocytes Relative 6  3 - 12 (%)   Monocytes Absolute 0.4  0.1 - 1.0 (K/uL)   Eosinophils Relative 1  0 - 5 (%)   Eosinophils Absolute 0.0  0.0 - 0.7 (K/uL)   Basophils Relative 1  0 - 1 (%)   Basophils Absolute 0.0  0.0 - 0.1 (K/uL)   No results found  for this or any previous visit (from the past 240 hour(s)). Creatinine:  Basename 11/16/11 1245  CREATININE 0.87    Impression/Assessment:  Penile swelling - likely hematoma from penile fracture Straining to urinate and weak stream likely from penile pain and low fluid intake.  Plan:  I discussed with the patient in the concern for a penile fracture. I drew him a picture of the anatomy. We discussed the nature risk and benefits of penile exploration via a circumcision with cystoscopy to rule out urethral injury. We discussed risks of surgery such as penile curvature, trouble getting and maintaining an erection, or trouble with penile sensation and orgasm. We discussed risks of urethral repair such as urethral stricture and obstruction among others. We discussed alternatives such as nontreatment with nonoperative management consisting of penile pressure, rest and ice. All questions answered. Patient elects to proceed with penile exploration and cystoscopy.  We also discussed the likelihood of achieving the goals of the procedure and potential problems that might occur during the procedure or recuperation.   Antony Haste 11/16/2011, 3:19 PM   Add: I spoke with patient again in pre-op area. I should add we discussed he could develop penile curvature, trouble getting and maintaining an erection, or trouble with penile sensation and orgasm simply from the injury he sustained this morning. All questions answered. On exam his pain is mainly on the proximal right corpora extending into the right SP area/suspensory ligament location. He has a fair amount of swelling in this area as well as the right penis. We discussed he may or may not have fractured the penis, he may have torn the suspensory ligament or ruptured a dorsal vein. He elects to proceed to the OR.

## 2011-11-16 NOTE — ED Notes (Signed)
Per ems: pt was having intercourse. Pt has penis bent back.pt is c/o pain. Pt is alert and oriented at this time. Pt brought by care link.

## 2011-11-16 NOTE — Anesthesia Postprocedure Evaluation (Signed)
  Anesthesia Post-op Note  Patient: Brady Braun  Procedure(s) Performed: Procedure(s) (LRB): CYSTOSCOPY FLEXIBLE (N/A) REPAIR OF FRACTURED PENIS (N/A)  Patient Location: PACU  Anesthesia Type: General  Level of Consciousness: awake and alert   Airway and Oxygen Therapy: Patient Spontanous Breathing  Post-op Pain: mild  Post-op Assessment: Post-op Vital signs reviewed, Patient's Cardiovascular Status Stable, Respiratory Function Stable, Patent Airway and No signs of Nausea or vomiting  Post-op Vital Signs: stable  Complications: No apparent anesthesia complications

## 2011-11-16 NOTE — ED Provider Notes (Signed)
History     CSN: [id number]  Arrival date & time 11/16/11  1029   First MD Initiated Contact with Patient 11/16/11 1107      Chief Complaint  Patient presents with  . Groin Swelling    (Consider location/radiation/quality/duration/timing/severity/associated sxs/prior treatment) The history is provided by the patient.   patient injured his penis while having sex this morning. Has had difficulty urinating since that time. Denies any numbness to the area. Pain is worse with movement made better with nothing. No prior history of injury. No medications or treatment done prior to arrival  History reviewed. No pertinent past medical history.  History reviewed. No pertinent past surgical history.  History reviewed. No pertinent family history.  History  Substance Use Topics  . Smoking status: Current Everyday Smoker    Types: Cigarettes  . Smokeless tobacco: Not on file  . Alcohol Use: Yes     occ      Review of Systems  All other systems reviewed and are negative.    Allergies  Review of patient's allergies indicates no known allergies.  Home Medications  No current outpatient prescriptions on file.  BP 107/67  Pulse 80  Temp(Src) 97.6 F (36.4 C) (Oral)  Resp 18  SpO2 98%  Physical Exam  Nursing note and vitals reviewed. Constitutional: He is oriented to person, place, and time. Vital signs are normal. He appears well-developed and well-nourished.  Non-toxic appearance. No distress.  HENT:  Head: Normocephalic and atraumatic.  Eyes: Conjunctivae, EOM and lids are normal. Pupils are equal, round, and reactive to light.  Neck: Normal range of motion. Neck supple. No tracheal deviation present. No mass present.  Cardiovascular: Normal rate, regular rhythm and normal heart sounds.  Exam reveals no gallop.   No murmur heard. Pulmonary/Chest: Effort normal and breath sounds normal. No stridor. No respiratory distress. He has no decreased breath sounds. He has no  wheezes. He has no rhonchi. He has no rales.  Abdominal: Soft. Normal appearance and bowel sounds are normal. He exhibits no distension. There is no tenderness. There is no rebound and no CVA tenderness.  Genitourinary:       Swelling along the shaft of the penis with slight ecchymosis. Testicles normal. Glands normal. Skin intact  Musculoskeletal: Normal range of motion. He exhibits no edema and no tenderness.  Neurological: He is alert and oriented to person, place, and time. He has normal strength. No cranial nerve deficit or sensory deficit. GCS eye subscore is 4. GCS verbal subscore is 5. GCS motor subscore is 6.  Skin: Skin is warm and dry. No abrasion and no rash noted.  Psychiatric: He has a normal mood and affect. His speech is normal and behavior is normal.    ED Course  Procedures (including critical care time)  Labs Reviewed - No data to display No results found.   No diagnosis found.    MDM  Spoke with urology, dr. Royann Shivers, and he will see at [facility].        Toy Baker, MD 11/16/11 [phone]

## 2011-11-16 NOTE — Anesthesia Preprocedure Evaluation (Addendum)
Anesthesia Evaluation  Patient identified by MRN, date of birth, ID band Patient awake    Reviewed: Allergy & Precautions, H&P , NPO status , Patient's Chart, lab work & pertinent test results  Airway Mallampati: II TM Distance: >3 FB Neck ROM: Full    Dental No notable dental hx.    Pulmonary neg pulmonary ROS, Current Smoker,  breath sounds clear to auscultation  Pulmonary exam normal       Cardiovascular negative cardio ROS  Rhythm:Regular Rate:Normal     Neuro/Psych negative neurological ROS  negative psych ROS   GI/Hepatic negative GI ROS, Neg liver ROS,   Endo/Other  negative endocrine ROS  Renal/GU negative Renal ROS  negative genitourinary   Musculoskeletal negative musculoskeletal ROS (+)   Abdominal   Peds negative pediatric ROS (+)  Hematology negative hematology ROS (+)   Anesthesia Other Findings   Reproductive/Obstetrics negative OB ROS                           Anesthesia Physical Anesthesia Plan  ASA: II  Anesthesia Plan: General   Post-op Pain Management:    Induction: Intravenous  Airway Management Planned: LMA  Additional Equipment:   Intra-op Plan:   Post-operative Plan: Extubation in OR  Informed Consent: I have reviewed the patients History and Physical, chart, labs and discussed the procedure including the risks, benefits and alternatives for the proposed anesthesia with the patient or authorized representative who has indicated his/her understanding and acceptance.   Dental advisory given  Plan Discussed with: CRNA  Anesthesia Plan Comments:         Anesthesia Quick Evaluation

## 2011-11-16 NOTE — ED Notes (Signed)
Reports having pain and swelling to penis since having sex last night and pt is afraid that he has penile fracture.

## 2011-11-16 NOTE — ED Notes (Signed)
Eskridge, MD gave verbal orders for NPO and pain control. Alerting OR for pt ASAP.

## 2011-11-16 NOTE — ED Notes (Signed)
ZOX:WRUE<AV> Expected date:<BR> Expected time:<BR> Means of arrival:<BR> Comments:<BR> carelink bring pt to see urology

## 2011-11-16 NOTE — ED Notes (Signed)
Urology called. md eskridge is coming to see pt

## 2011-11-16 NOTE — ED Notes (Signed)
Patient talking on the phone to family member.  States feels better pain now 6/10 achy dull pain.

## 2011-11-17 MED ORDER — DSS 100 MG PO CAPS: 100.0000 mg | ORAL_CAPSULE | Freq: Two times a day (BID) | ORAL | Status: AC

## 2011-11-17 MED ORDER — HYDROCODONE-ACETAMINOPHEN 5-325 MG PO TABS: 1.0000 | ORAL_TABLET | ORAL | Status: AC | PRN

## 2011-11-17 MED ORDER — SULFAMETHOXAZOLE-TMP DS 800-160 MG PO TABS: 1.0000 | ORAL_TABLET | Freq: Two times a day (BID) | ORAL | Status: AC

## 2011-11-17 NOTE — Discharge Instructions (Signed)
Circumcision/Penile Fracture Repair, Adult Care After  These instructions give you information on caring for yourself after your procedure. Your doctor may also give you more specific instructions. Call your doctor if you have any problems or questions after your procedure. You did not have the foreskin removed (this was done when you were a child), but the same incision was used to repair the penis. HOME CARE  Only take medicine as told by your doctor.   Remove bandages (dressings) tomorrow, November 18, 2011.    Carefully remove the bandage.    Do not have sex for at least SIX WEEKS and until your doctor says it is OK  Do not get your wound wet for 2 days or as told by your doctor.    You may take a sponge bath. Clean around the surgical cut gently with mild soap and water.   You may take a shower tomorrow, November 18, 2011. Do not scrub the incision. Clean around the surgical cut gently with mild soap and water.   Do not take a tub bath. After you shower, gently pat your surgical cut dry. Do not rub it.   Avoid heavy lifting.   Avoid contact sports, biking, or swimming until you have healed.  GET HELP RIGHT AWAY IF:   You develop a fever of 102 F (38.9 C) or higher.   You cannot pee (urinate).   You have pain when you pee.   Your pain is not helped by medicines.   There is more redness or puffiness (swelling) than expected.   There is yellowish white fluid (pus) coming from your wound.   You have bleeding that does not stop when you press on it.   You are not healing as fast as you should be.  MAKE SURE YOU:  Understand these instructions.   Will watch your condition.   Will get help right away if you are not doing well or get worse.  Document Released: 01/11/2008 Document Revised: 07/14/2011 Document Reviewed: 01/11/2008 [facility] Patient Information 2012 [place], [place].

## 2011-11-17 NOTE — Op Note (Signed)
NAMEAMES, HOBAN NO.:  [id number]  MEDICAL RECORD NO.:  [id number]  LOCATION:  1404                         FACILITY:  [facility]  PHYSICIAN:  Jerilee Field, MD   DATE OF BIRTH:  12-11-1970  DATE OF PROCEDURE: DATE OF DISCHARGE:  11/17/2011                              OPERATIVE REPORT   PREOPERATIVE DIAGNOSES:  Right penile fracture and weak urinary stream.  POSTOPERATIVE DIAGNOSIS:  Right penile fracture.  PROCEDURE:  Repair of fractured penis through circumcision and penile exploration; cystoscopy, flexible.  SURGEON:  Jerilee Field, MD  TYPE OF ANESTHESIA:  General.  INDICATION FOR PROCEDURE:  Mr. Brady Braun is a 41 year old male.  This morning, he was having sex in a rear entry fashion when he missed the vagina ramming his penis into the pubic bone.  Then, he heard a pop and had severe penile pain situated over the right proximal corpora with swelling in the penis and suprapubic area and right hemiscrotum.  On exam, he did have discomfort.  We talked about the possibility of a penile fracture.  I also felt like he needed to urinate.  He did urinate in the ER, but had a weak stream although the urine was clear.  I discussed with the patient the nature, risks, and benefits of the penile exploration via a circumcision, and flexible cystoscopy to rule out and repair a penile fracture or corpora injury as well as the urethral injury.  We discussed that the nature of his injury and/or the surgery could result in trouble with sensation in orgasm, penile curvature, and trouble getting and maintaining an erection.  Among others, we talked about bleeding, infection, and poor cosmesis as well.  All questions were answered.  The patient elected to proceed.  We discussed nonoperative management consisting of simple observation as well.  FINDINGS:  The patient had a 1 to 1.5 cm tear in the right inferior proximal corpora distal to the penoscrotal junction.  The  tear extended from lateral to medial and stopped on the inner side of the the urethra. There was also some disruption of the underlying spongiosum.  After repair, an artificial erection was obtained.  There was noted to be no extravasation of saline.  There was a good erection that was straight without curve to the left or right, but a mild curvature upwards.  Upon cystoscopy, the urethra was normal.  DESCRIPTION OF PROCEDURE:  After consent was obtained, the patient was brought to the operating room.  A time-out was performed to confirm the patient and procedure.  After adequate anesthesia, the lower abdomen and external genitalia were prepped and draped in the usual fashion. Preoperative antibiotics and SCDs have been placed.  Circumcision incision was made through the patient's prior circumcision and carried down through the dartos fascia to the corpora.  The dartos fascia was then dissected off of Buck's fascia all the way back to the proximal penis and penoscrotal junction.  On the right inferior, there was a large hematoma throughout the dartos tissue and then there was a tear in Buck's fascia and an obvious tear in the right proximal corpora with a large clot.  Dartos fascia was  dissected all the way off to the penoscrotal junction and inspection of the corpora proximally down into the scrotum showed the corpora to be normal.  The urethra also appeared normal.  In the right proximal inferior corpora, there was a tear.  The clot was removed and the injury was irrigated.  After removing the clot, he began to bleed from the spongiosal tissue underneath which was torn as well as it appeared to be torn and disrupted.  Due to the bleeding from the spongiosal tissue, I could not see to adequately dissect and determine the extent of the injury or to repair.  Therefore, a penile tourniquet was placed with a Penrose drain around the penis proximal to the injury.  I then irrigated the wound  and dissected Buck's fascia which was already torn open off the corpora and urethra medially. The injury went down adjacent to the urethra; therefore, I had to dissect the corpora off the urethra which I did without difficulty and without injuring the urethra.  Now having visualized the full extent of the injury to the right corpora, the corpora was irrigated again and then closed with interrupted 4-0 Vicryl suture in 2 layers.  Using a 60 cc syringe, injectable saline was then injected into the penis, which provided a good erection as above without extravasation of saline. Next, Buck's fascia was run closed and they aligned parallel to the penis to help prevent any future chordee.  Of note, the corpora tore perpendicular to the penis and had to be closed in this way.  Excellent hemostasis was ensured after the tourniquet was removed and the area observed.  There was adequate hemostasis.  The penis and the dartos fascia were then copiously irrigated.  The patient had a large right inferior hematoma in the dartos fascia and then somewhat distorted the skin.  Therefore, I looked as best as I could at the inferior rasping markings and the markings of the circumcision, and I lined up the dartos fascia with interrupted Vicryl sutures to provide some support for the closure.  Having aligned the skin, the skin was then run closed with 3-0 chromic suture starting ventrally and coming up on the right dorsally and then ventrally and coming off on the left dorsally.  This provided a good closure.  After closing the skin and wrapping the penis in a fresh lap, flexible cystoscopy was performed to rule out any missed urethral injury as I had dissected near the urethra; and the urethra, prostate, and bladder were completely normal without injury, stone, or mass.  The prostate was short and nonobstructing.  There were no lesions or foreign bodies in the bladder.  The cystoscope was removed and a 16-French  Foley was placed.  The balloon was inflated and seated at the bladder neck and kept clamped.  A Xeroform gauze was then placed on the circumcision. The penis was wrapped in a light Kling without any pressure on the penis.  The Foley was then connected to gravity drainage draining clear urine.  The patient was cleaned up.  Mesh panties were placed and the penis laid down on the patient's abdomen to provide support and the catheter run up and then down the patient's leg.  He was then awakened, taken to recovery room in stable condition.  COMPLICATIONS:  None.  SPECIMENS:  None.  ESTIMATED BLOOD LOSS:  30 mL.  DRAINS:  Foley catheter.  COUNTS:  Correct.  DISPOSITION:  The patient is stable to PACU.  ______________________________ Jerilee Field, MD     ME/MEDQ  D:  11/16/2011  T:  11/17/2011  Job:  [id number]

## 2011-11-17 NOTE — Progress Notes (Signed)
Pt ambulated in hall, approx 100 feet. Pt tolerated activity well, did have one episode of vomiting perhaps d/t pain medication, pt felt better after vomiting and nausea relieved with zofran. Will continue to monitor. Daphine Deutscher, [employer]

## 2011-11-17 NOTE — Discharge Summary (Addendum)
Physician Discharge Summary  Patient ID: SIAOSI ALTER MRN: [id number] DOB/AGE: Jan 08, 1971 41 y.o.  Admit date: 11/16/2011 Discharge date: 11/17/2011  Admission Diagnoses:  Discharge Diagnoses:  Penile Fracture  Discharged Condition: good  Hospital Course: Pt admitted for overnight observation following penile fracture. He has done well and remained stable.   Consults: None  Significant Diagnostic Studies: none  Treatments: surgery: Penile exploration and repair of injury to right corpora (penile fracture), Cystoscopy  Discharge Exam: Blood pressure 120/66, pulse 76, temperature 98.3 F (36.8 C), temperature source Oral, resp. rate 16, height 5\' 7"  (1.702 m), weight 54.885 kg (121 lb), SpO2 95.00%. Pt in NAD A&O x 3 Abd - soft, NT Penis - dressing in place. Mild edema from hematoma and procedure. Foley in place - urine clear (order for foley D/C on chart). Scrotal skin appears normal.    Disposition: 01-Home or Self Care   Medication List  As of 11/17/2011  8:30 AM   TAKE these medications         DSS 100 MG Caps   Take 100 mg by mouth 2 (two) times daily.      HYDROcodone-acetaminophen 5-325 MG per tablet   Commonly known as: NORCO   Take 1-2 tablets by mouth every 4 (four) hours as needed.      sulfamethoxazole-trimethoprim 800-160 MG per tablet   Commonly known as: BACTRIM DS   Take 1 tablet by mouth 2 (two) times daily.           Follow-up Information    Follow up with DEFAULT,PROVIDER, MD .         Signed: Antony Haste 11/17/2011, 8:30 AM

## 2011-11-30 ENCOUNTER — Encounter (HOSPITAL_COMMUNITY): Payer: Self-pay | Admitting: Urology

## 2013-08-01 ENCOUNTER — Emergency Department (HOSPITAL_COMMUNITY): Payer: Self-pay

## 2013-08-01 ENCOUNTER — Encounter (HOSPITAL_COMMUNITY): Payer: Self-pay | Admitting: Emergency Medicine

## 2013-08-01 ENCOUNTER — Emergency Department (HOSPITAL_COMMUNITY)
Admission: EM | Admit: 2013-08-01 | Discharge: 2013-08-01 | Disposition: A | Discharge status: Home / Self Care | Payer: Self-pay | Attending: Emergency Medicine | Admitting: Emergency Medicine

## 2013-08-01 DIAGNOSIS — S53104A Unspecified dislocation of right ulnohumeral joint, initial encounter: Secondary | ICD-10-CM

## 2013-08-01 DIAGNOSIS — W101XXA Fall (on)(from) sidewalk curb, initial encounter: Secondary | ICD-10-CM | POA: Insufficient documentation

## 2013-08-01 DIAGNOSIS — Y9289 Other specified places as the place of occurrence of the external cause: Secondary | ICD-10-CM | POA: Insufficient documentation

## 2013-08-01 DIAGNOSIS — F17200 Nicotine dependence, unspecified, uncomplicated: Secondary | ICD-10-CM | POA: Insufficient documentation

## 2013-08-01 DIAGNOSIS — W010XXA Fall on same level from slipping, tripping and stumbling without subsequent striking against object, initial encounter: Secondary | ICD-10-CM | POA: Insufficient documentation

## 2013-08-01 DIAGNOSIS — Y9301 Activity, walking, marching and hiking: Secondary | ICD-10-CM | POA: Insufficient documentation

## 2013-08-01 DIAGNOSIS — S53126A Posterior dislocation of unspecified ulnohumeral joint, initial encounter: Secondary | ICD-10-CM | POA: Insufficient documentation

## 2013-08-01 DIAGNOSIS — IMO0002 Reserved for concepts with insufficient information to code with codable children: Secondary | ICD-10-CM | POA: Insufficient documentation

## 2013-08-01 MED ORDER — HYDROMORPHONE HCL PF 1 MG/ML IJ SOLN
1.0000 mg | INTRAMUSCULAR | Status: DC | PRN
  Administered 2013-08-01 (×2): 1 mg via INTRAVENOUS
  Filled 2013-08-01 (×2): qty 1

## 2013-08-01 MED ORDER — PROPOFOL 10 MG/ML IV BOLUS
0.5000 mg/kg | INTRAVENOUS | Status: DC | PRN
  Administered 2013-08-01: 75 mg via INTRAVENOUS
  Filled 2013-08-01: qty 20

## 2013-08-01 MED ORDER — PROPOFOL 10 MG/ML IV BOLUS
INTRAVENOUS | Status: AC | PRN
  Administered 2013-08-01: 50 mg via INTRAVENOUS

## 2013-08-01 MED ORDER — SODIUM CHLORIDE 0.9 % IV SOLN
INTRAVENOUS | Status: DC
  Administered 2013-08-01: 17:00:00 via INTRAVENOUS

## 2013-08-01 MED ORDER — TETANUS-DIPHTH-ACELL PERTUSSIS 5-2.5-18.5 LF-MCG/0.5 IM SUSP
0.5000 mL | Freq: Once | INTRAMUSCULAR | Status: AC
  Administered 2013-08-01: 0.5 mL via INTRAMUSCULAR
  Filled 2013-08-01: qty 0.5

## 2013-08-01 MED ORDER — HYDROCODONE-ACETAMINOPHEN 5-325 MG PO TABS: 1.0000 | ORAL_TABLET | ORAL | Status: DC | PRN

## 2013-08-01 NOTE — ED Notes (Signed)
Ortho tech at bedside placing splint 

## 2013-08-01 NOTE — Progress Notes (Signed)
Orthopedic Tech Progress Note Patient Details:  Brady Braun 07/22/1971 [id number]  Ortho Devices Type of Ortho Device: Ace wrap;Arm sling Ortho Device/Splint Location: rue Ortho Device/Splint Interventions: Application   Brady Braun 08/01/2013, 6:03 PM

## 2013-08-01 NOTE — ED Notes (Signed)
Pt's elbow reduced by Dr. Lynelle Doctor and Lemont Fillers, PA.

## 2013-08-01 NOTE — ED Notes (Signed)
Pt was playing with grandchild and fell off a curb onto R elbow and "Felt it pop out of place." pt with obvious deformity to elbow with severe pain. Pt can wiggle digits, skin w/d. Denies any other complaints r/t the fall.

## 2013-08-01 NOTE — ED Provider Notes (Signed)
CSN: [id number]     Arrival date & time 08/01/13  1623 History   None    Chief Complaint  Patient presents with  . Elbow Injury    Patient is a 42 y.o. male presenting with arm injury. The history is provided by the patient.  Arm Injury Location:  Elbow Time since incident: Shortly before arrival. Injury: yes   Elbow location:  R elbow Pain details:    Quality:  Sharp   Radiates to:  Does not radiate   Severity:  Severe   Onset quality:  Sudden   Timing:  Constant Dislocation: possible.   Prior injury to area:  No Relieved by:  Nothing Worsened by:  Movement Associated symptoms: decreased range of motion and swelling   Associated symptoms: no fever, no neck pain, no numbness and no stiffness   Associated symptoms comment:  Abrasion to right knee  Pt was running outside when he slipped and fell.  He things he broke or dislocated his elbow.  He scraped his right knee but the pain is minor and he denies any other injuries.  No LOC  History reviewed. No pertinent past medical history. Past Surgical History  Procedure Laterality Date  . Cystoscopy  11/16/2011    Procedure: CYSTOSCOPY FLEXIBLE;  Surgeon: Antony Haste, MD;  Location: [facility];  Service: Urology;  Laterality: N/A;   History reviewed. No pertinent family history. History  Substance Use Topics  . Smoking status: Current Every Day Smoker    Types: Cigarettes  . Smokeless tobacco: Not on file  . Alcohol Use: Yes     Comment: occ    Review of Systems  Constitutional: Negative for fever.  Musculoskeletal: Negative for neck pain and stiffness.  All other systems reviewed and are negative.    Allergies  Review of patient's allergies indicates no known allergies.  Home Medications   Current Outpatient Rx  Name  Route  Sig  Dispense  Refill  . HYDROcodone-acetaminophen (NORCO/VICODIN) 5-325 MG per tablet   Oral   Take 1-2 tablets by mouth every 4 (four) hours as needed.   16 tablet   0    BP  105/72  Pulse 80  Temp(Src) 97.4 F (36.3 C) (Oral)  Resp 16  Wt 121 lb 0.5 oz (54.9 kg)  SpO2 100% Physical Exam  Nursing note and vitals reviewed. Constitutional: He appears well-developed and well-nourished. No distress.  HENT:  Head: Normocephalic.  Right Ear: External ear normal.  Left Ear: External ear normal.  Small less than 1/4 cam abrasion right forehead   Eyes: Conjunctivae are normal. Right eye exhibits no discharge. Left eye exhibits no discharge. No scleral icterus.  Neck: Neck supple. No tracheal deviation present.  Cardiovascular: Normal rate.   Pulmonary/Chest: Effort normal. No stridor. No respiratory distress.  Musculoskeletal: He exhibits no edema.       Right elbow: He exhibits decreased range of motion, swelling and deformity. He exhibits no laceration. Tenderness found. Radial head, medial epicondyle, lateral epicondyle and olecranon process tenderness noted.       Right knee: He exhibits no swelling and no deformity. No tenderness found.       Arms:      Legs: Neurological: He is alert. Cranial nerve deficit: no gross deficits.  Skin: Skin is warm and dry. No rash noted.  Psychiatric: He has a normal mood and affect.    ED Course  Procedural sedation Date/Time: 08/01/2013 5:37 PM Performed by: Linwood Dibbles R Authorized by: Lynelle Doctor,  Montavius Subramaniam R Consent: Verbal consent obtained. written consent obtained. Risks and benefits: risks, benefits and alternatives were discussed Consent given by: patient Patient understanding: patient states understanding of the procedure being performed Patient consent: the patient's understanding of the procedure matches consent given Procedure consent: procedure consent matches procedure scheduled Relevant documents: relevant documents present and verified Test results: test results available and properly labeled Imaging studies: imaging studies available Patient identity confirmed: verbally with patient Time out: Immediately prior  to procedure a "time out" was called to verify the correct patient, procedure, equipment, support staff and site/side marked as required. Patient sedated: yes Sedatives: propofol Vitals: Vital signs were monitored during sedation. Patient tolerance: Patient tolerated the procedure well with no immediate complications.  Reduction of dislocation Date/Time: 08/01/2013 5:38 PM Performed by: Linwood Dibbles R Authorized by: Linwood Dibbles R Consent: Verbal consent obtained. written consent obtained. Risks and benefits: risks, benefits and alternatives were discussed Consent given by: patient   (including critical care time) Labs Review Labs Reviewed - No data to display Imaging Review Dg Elbow 2 Views Right  08/01/2013   CLINICAL DATA:  Post reduction radiographs.  EXAM: RIGHT ELBOW - 2 VIEW  COMPARISON:  08/01/2013.  FINDINGS: Successful reduction of elbow dislocation. No large fracture fragments are identified. Application a posterior splint material.  IMPRESSION: Successful reduction of posterior elbow dislocation.   Electronically Signed   By: Andreas Newport M.D.   On: 08/01/2013 18:27   Dg Elbow 2 Views Right  08/01/2013   CLINICAL DATA:  Fall  EXAM: RIGHT ELBOW - 2 VIEW  COMPARISON:  None.  FINDINGS: Posterior elbow dislocation. Possible fracture the coronoid process. Postreduction views are suggested. No fracture of the humerus.  IMPRESSION: Posterior elbow dislocation.  Postreduction views are suggested.   Electronically Signed   By: Marlan Palau M.D.   On: 08/01/2013 17:14    EKG Interpretation   None      1832  Alert and awake without complaints.   MDM   1. Elbow dislocation, right, initial encounter    Dislocation successfully reduced.   Splinted in ed and placed in a sling.  Will refer pt to Dr Merlyn Lot for follow up.   Celene Kras, MD 08/01/13 [phone]

## 2013-08-01 NOTE — ED Notes (Signed)
Xray at bedside doing portable.

## 2014-02-14 ENCOUNTER — Encounter (HOSPITAL_COMMUNITY): Payer: Self-pay | Admitting: Emergency Medicine

## 2014-02-14 DIAGNOSIS — T50Z95A Adverse effect of other vaccines and biological substances, initial encounter: Secondary | ICD-10-CM | POA: Insufficient documentation

## 2014-02-14 DIAGNOSIS — Y9289 Other specified places as the place of occurrence of the external cause: Secondary | ICD-10-CM | POA: Insufficient documentation

## 2014-02-14 DIAGNOSIS — R112 Nausea with vomiting, unspecified: Secondary | ICD-10-CM | POA: Insufficient documentation

## 2014-02-14 DIAGNOSIS — Y93A6 Activity, grass drills: Secondary | ICD-10-CM | POA: Insufficient documentation

## 2014-02-14 DIAGNOSIS — T782XXA Anaphylactic shock, unspecified, initial encounter: Secondary | ICD-10-CM | POA: Insufficient documentation

## 2014-02-14 DIAGNOSIS — R000 Tachycardia, unspecified: Secondary | ICD-10-CM | POA: Insufficient documentation

## 2014-02-14 DIAGNOSIS — F17200 Nicotine dependence, unspecified, uncomplicated: Secondary | ICD-10-CM | POA: Insufficient documentation

## 2014-02-14 NOTE — ED Notes (Addendum)
Pt. reports insect bite to right chin while cutting grassthis evening , generalized itching / airway intact / respirations unlabored . Vomitted at triage.

## 2014-02-15 ENCOUNTER — Emergency Department (HOSPITAL_COMMUNITY)
Admission: EM | Admit: 2014-02-15 | Discharge: 2014-02-15 | Disposition: A | Discharge status: Home / Self Care | Payer: Self-pay | Attending: Emergency Medicine | Admitting: Emergency Medicine

## 2014-02-15 DIAGNOSIS — T782XXA Anaphylactic shock, unspecified, initial encounter: Secondary | ICD-10-CM

## 2014-02-15 DIAGNOSIS — R000 Tachycardia, unspecified: Secondary | ICD-10-CM

## 2014-02-15 DIAGNOSIS — R112 Nausea with vomiting, unspecified: Secondary | ICD-10-CM

## 2014-02-15 MED ORDER — DIPHENHYDRAMINE HCL 25 MG PO TABS: 25.0000 mg | ORAL_TABLET | Freq: Three times a day (TID) | ORAL | Status: DC | PRN

## 2014-02-15 MED ORDER — ONDANSETRON 4 MG PO TBDP
4.0000 mg | ORAL_TABLET | ORAL | Status: AC
  Administered 2014-02-15: 4 mg via ORAL
  Filled 2014-02-15: qty 1

## 2014-02-15 MED ORDER — EPINEPHRINE 0.3 MG/0.3ML IJ SOAJ: 0.3000 mg | INTRAMUSCULAR | Status: DC | PRN

## 2014-02-15 MED ORDER — PREDNISONE 20 MG PO TABS
60.0000 mg | ORAL_TABLET | Freq: Once | ORAL | Status: AC
  Administered 2014-02-15: 60 mg via ORAL
  Filled 2014-02-15: qty 3

## 2014-02-15 MED ORDER — PREDNISONE 20 MG PO TABS: ORAL_TABLET | ORAL | Status: DC

## 2014-02-15 NOTE — ED Provider Notes (Signed)
CSN: [id number]     Arrival date & time 02/14/14  2124 History   First MD Initiated Contact with Patient 02/15/14 0124     Chief Complaint  Patient presents with  . Insect Bite     (Consider location/radiation/quality/duration/timing/severity/associated sxs/prior Treatment) HPI  This is a generally healthy man who says he sustained some type of this being to his right upper lip earlier this evening while cutting the lawn. Shortly after that, he felt tingling around his lips, this was followed by diffuse urticaria and diffuse paresthesias. He felt himself wheezing and felt mildly short of breath. He became nauseated and vomited 4 times. He took Benadryl prior to arrival. He feels like his symptoms have resolved.  He denies history of similar symptoms. He denies chest pain.  History reviewed. No pertinent past medical history. Past Surgical History  Procedure Laterality Date  . Cystoscopy  11/16/2011    Procedure: CYSTOSCOPY FLEXIBLE;  Surgeon: Antony HasteMatthew Ramsey Eskridge, MD;  Location: [facility];  Service: Urology;  Laterality: N/A;   No family history on file. History  Substance Use Topics  . Smoking status: Current Every Day Smoker    Types: Cigarettes  . Smokeless tobacco: Not on file  . Alcohol Use: Yes     Comment: occ    Review of Systems Ten point review of symptoms performed and is negative with the exception of symptoms noted above.    Allergies  Review of patient's allergies indicates no known allergies.  Home Medications   Prior to Admission medications   Not on File   BP 116/78  Pulse 99  Temp(Src) 98.7 F (37.1 C) (Oral)  Resp 20  SpO2 100% Physical Exam Gen: well developed and well nourished appearing Head: NCAT Eyes: PERL, EOMI Nose: no epistaixis or rhinorrhea Mouth/throat: mucosa is moist and pink Neck: supple, no stridor Lungs: CTA B, no wheezing, rhonchi or rales CV: rapid and regular - pulase 104 bpm, no murmur, extremities appear well perfused.   Abd: soft, notender, nondistended Back: no ttp, no cva ttp Skin: warm and dry, no lesions Ext: normal to inspection, no dependent edema Neuro: CN ii-xii grossly intact, no focal deficits Psyche; normal affect,  calm and cooperative.   ED Course  Procedures (including critical care time) Labs Review   MDM   He should status post anaphylactoid reaction to an unknown insect or bug bite. His symptoms seem to have resolved entirely after treatment with Benadryl. He is mildly tachycardic. This is likely secondary to vomiting.  The insect stings occurred several hours ago. The patient declines an IV. However, we will treat with Zofran and the patient will by mouth challenge with clears. He wishes to go home.  I have explained to himG the concern for the likelihood of a recurrent anaphylactoid or anaphylactic reaction in the event of recurrent sting or bite by similar insect. We will prescribe EpiPen. I have educated the patient regarding the use of the EpiPen.  We'll also treat this patient with Benadryl when necessary and prednisone taper.   Brandt LoosenJulie Manly, MD 02/15/14 [phone]

## 2014-02-15 NOTE — Discharge Instructions (Signed)

## 2014-02-15 NOTE — ED Notes (Signed)
Pt reports being bitten by a bug around 2030 last night when he was mowing the yard. States that his face, and body started to swell up. States throat feels sore, as well. States he took 2 benadryl tablets last night immediately after he was stung. Pt alert and oriented x 4, no swelling noted, reports generalized body aches.

## 2014-03-11 ENCOUNTER — Encounter (HOSPITAL_COMMUNITY): Payer: Self-pay | Admitting: Emergency Medicine

## 2014-03-11 DIAGNOSIS — Y929 Unspecified place or not applicable: Secondary | ICD-10-CM | POA: Insufficient documentation

## 2014-03-11 DIAGNOSIS — T6391XA Toxic effect of contact with unspecified venomous animal, accidental (unintentional), initial encounter: Secondary | ICD-10-CM | POA: Insufficient documentation

## 2014-03-11 DIAGNOSIS — F17200 Nicotine dependence, unspecified, uncomplicated: Secondary | ICD-10-CM | POA: Insufficient documentation

## 2014-03-11 DIAGNOSIS — T63461A Toxic effect of venom of wasps, accidental (unintentional), initial encounter: Secondary | ICD-10-CM | POA: Insufficient documentation

## 2014-03-11 DIAGNOSIS — Y939 Activity, unspecified: Secondary | ICD-10-CM | POA: Insufficient documentation

## 2014-03-11 NOTE — ED Notes (Signed)
The paitent said he got stung earlier today by a yellow jacket.  He said he did not come ealier because he was not swollen.  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  He came to be evaluated.

## 2014-03-12 ENCOUNTER — Emergency Department (HOSPITAL_COMMUNITY)
Admission: EM | Admit: 2014-03-12 | Discharge: 2014-03-12 | Disposition: A | Discharge status: Home / Self Care | Payer: Self-pay | Attending: Emergency Medicine | Admitting: Emergency Medicine

## 2014-03-12 DIAGNOSIS — T63441A Toxic effect of venom of bees, accidental (unintentional), initial encounter: Secondary | ICD-10-CM

## 2014-03-12 MED ORDER — EPINEPHRINE 0.3 MG/0.3ML IJ SOAJ: 0.3000 mg | INTRAMUSCULAR | Status: AC | PRN

## 2014-03-12 MED ORDER — IBUPROFEN 600 MG PO TABS: 600.0000 mg | ORAL_TABLET | Freq: Four times a day (QID) | ORAL | Status: AC | PRN

## 2014-03-12 MED ORDER — DIPHENHYDRAMINE HCL 25 MG PO TABS: 25.0000 mg | ORAL_TABLET | Freq: Three times a day (TID) | ORAL | Status: AC | PRN

## 2014-03-12 MED ORDER — PREDNISONE 20 MG PO TABS: ORAL_TABLET | ORAL | Status: AC

## 2014-03-12 NOTE — Discharge Instructions (Signed)

## 2014-03-12 NOTE — ED Provider Notes (Signed)
CSN: [id number]     Arrival date & time 03/11/14  2227 History   First MD Initiated Contact with Patient 03/12/14 0036     Chief Complaint  Patient presents with  . Insect Bite    The paitent said he got stung earlier today by a yellow jacket.  He said he did not come ealier because he was not swollen.       (Consider location/radiation/quality/duration/timing/severity/associated sxs/prior Treatment) HPI Comments: Patient is a 43 year old male with no significant past medical history who presents to the emergency department for further evaluation after being stung by a yellow jacket in his distal right calf. Patient states that he was stung by a yellow jacket on 03/10/2014. He states that after this time he experienced some aching discomfort which has persisted. He also notes some mild lip and tongue swelling which responded well to Benadryl. Patient denies any syncope, drooling, inability to swallow, and shortness of breath. No nausea or vomiting. Patient states that he was evaluated for a bee sting on 02/15/2014 at which time he was given a prescription for an EpiPen. Patient states that he lost this prescription. He came to the emergency department today requesting a new EpiPen prescription as patient works outside a lot and is frequently exposed to bees.  The history is provided by the patient. No language interpreter was used.    History reviewed. No pertinent past medical history. Past Surgical History  Procedure Laterality Date  . Cystoscopy  11/16/2011    Procedure: CYSTOSCOPY FLEXIBLE;  Surgeon: Antony HasteMatthew Ramsey Eskridge, MD;  Location: [facility];  Service: Urology;  Laterality: N/A;   History reviewed. No pertinent family history. History  Substance Use Topics  . Smoking status: Current Every Day Smoker    Types: Cigarettes  . Smokeless tobacco: Not on file  . Alcohol Use: Yes     Comment: occ    Review of Systems  HENT: Negative for trouble swallowing.        + Lip/tongue  swelling  Respiratory: Negative for shortness of breath and wheezing.   Gastrointestinal: Negative for nausea and vomiting.  Musculoskeletal: Positive for myalgias.  Neurological: Negative for syncope.  All other systems reviewed and are negative.    Allergies  Review of patient's allergies indicates no known allergies.  Home Medications   Prior to Admission medications   Medication Sig Start Date End Date Taking? Authorizing Provider  diphenhydrAMINE (BENADRYL) 25 MG tablet Take 1 tablet (25 mg total) by mouth every 8 (eight) hours as needed for itching. 03/12/14   Antony MaduraKelly Dalbert Stillings, PA-C  EPINEPHrine (EPIPEN) 0.3 mg/0.3 mL IJ SOAJ injection Inject 0.3 mLs (0.3 mg total) into the muscle as needed. 03/12/14   Antony MaduraKelly Kawika Bischoff, PA-C  ibuprofen (ADVIL,MOTRIN) 600 MG tablet Take 1 tablet (600 mg total) by mouth every 6 (six) hours as needed. 03/12/14   Antony MaduraKelly Kemia Wendel, PA-C  predniSONE (DELTASONE) 20 MG tablet 3 tabs po day one, then 2 po daily x 4 days 03/12/14   Antony MaduraKelly Makyah Lavigne, PA-C   BP 127/76  Pulse 86  Temp(Src) 97.7 F (36.5 C) (Oral)  Resp 18  SpO2 100%  Physical Exam  Nursing note and vitals reviewed. Constitutional: He is oriented to person, place, and time. He appears well-developed and well-nourished. No distress.  Nontoxic/nonseptic appearing  HENT:  Head: Normocephalic and atraumatic.  Mouth/Throat: Oropharynx is clear and moist. No oropharyngeal exudate.  Oropharynx clear. No angioedema noted. Patient tolerating secretions without difficulty or drooling.  Eyes: Conjunctivae and EOM are  normal. No scleral icterus.  Neck: Normal range of motion.  Cardiovascular: Normal rate, regular rhythm and intact distal pulses.   Distal radial pulse 2+ in left upper extremity.  Pulmonary/Chest: Effort normal. No respiratory distress. He has no wheezes. He has no rales.  No tachypnea or dyspnea. Chest expansion symmetric. No retractions or accessory muscle use.  Musculoskeletal: Normal range of motion. He  exhibits tenderness.  Mild tenderness to right posterior calf. No swelling, erythema, deformity, or edema.  Neurological: He is alert and oriented to person, place, and time. He exhibits normal muscle tone. Coordination normal.  GCS 15. Patient moves extremities without ataxia.  Skin: Skin is warm and dry. No rash noted. He is not diaphoretic. No erythema. No pallor.  Psychiatric: He has a normal mood and affect. His behavior is normal.    ED Course  Procedures (including critical care time) Labs Review Labs Reviewed - No data to display  Imaging Review No results found.   EKG Interpretation None      MDM   Final diagnoses:  Bee sting reaction, accidental or unintentional, initial encounter    43 year old male presents 36 hours following bee sting. Patient endorsing some angioedema following bee sting which resolved with Benadryl. Patient concerned for potential anaphylaxis in the future and is requesting new prescription for EpiPen. Patient lost this prescription for this from a prior visit in July. Patient without signs of anaphylaxis today. No angioedema, wheezing, stridor, hypotension, or hypoxia. He is now 36 hours out from bee venom exposure. Patient stable and appropriate for discharge with prescriptions for symptomatic management should patient be stung in the future. Return precautions provided and discussed. Patient agreeable to plan with no unaddressed concerns.   Filed Vitals:   03/11/14 2234  BP: 127/76  Pulse: 86  Temp: 97.7 F (36.5 C)  TempSrc: Oral  Resp: 18  SpO2: 100%     Antony Madura, PA-C 03/12/14 [phone]

## 2014-03-12 NOTE — ED Provider Notes (Signed)
Medical screening examination/treatment/procedure(s) were performed by non-physician practitioner and as supervising physician I was immediately available for consultation/collaboration.    Vida RollerBrian D Harlan Vinal, MD 03/12/14 [phone]

## 2014-05-13 IMAGING — CR DG ELBOW 2V*R*
2 series · 2 of 2 positions shown · non-contrast
Comparison: 08/01/2013.

CLINICAL DATA: Post reduction radiographs.

EXAM:
RIGHT ELBOW - 2 VIEW

[AP]
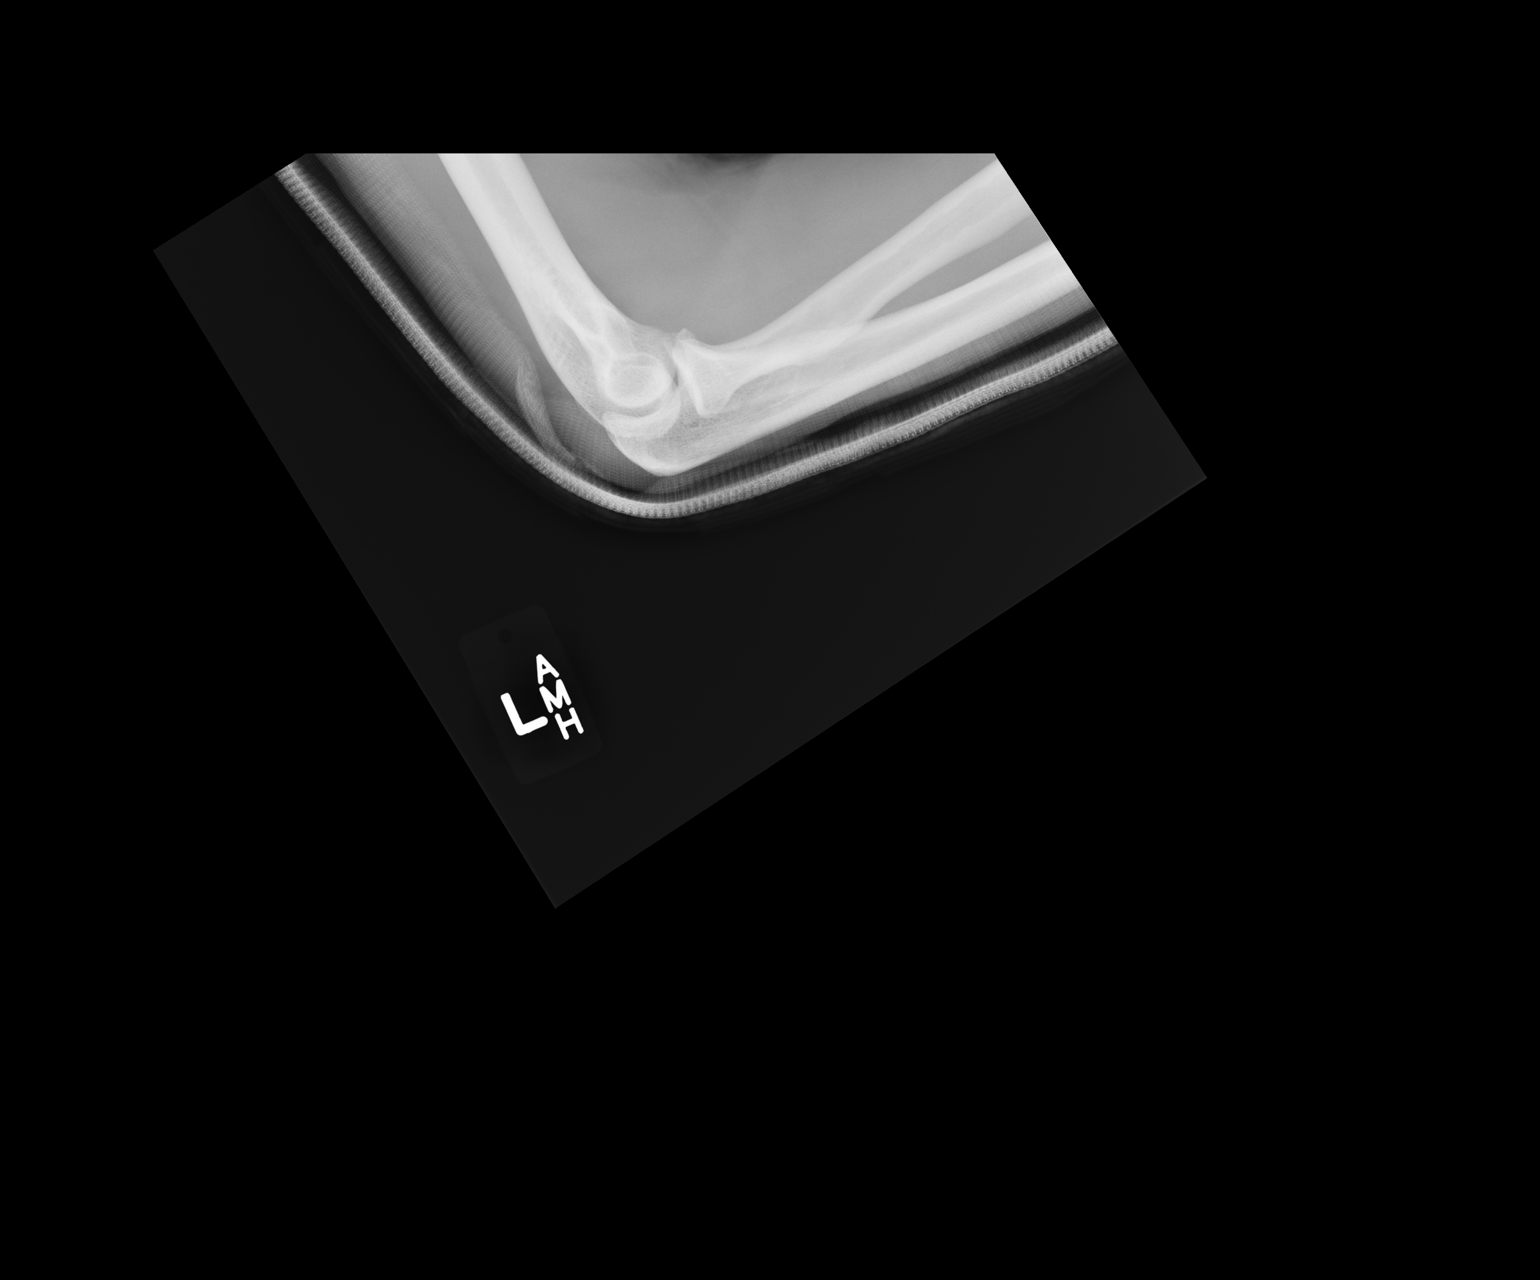

[lateral]
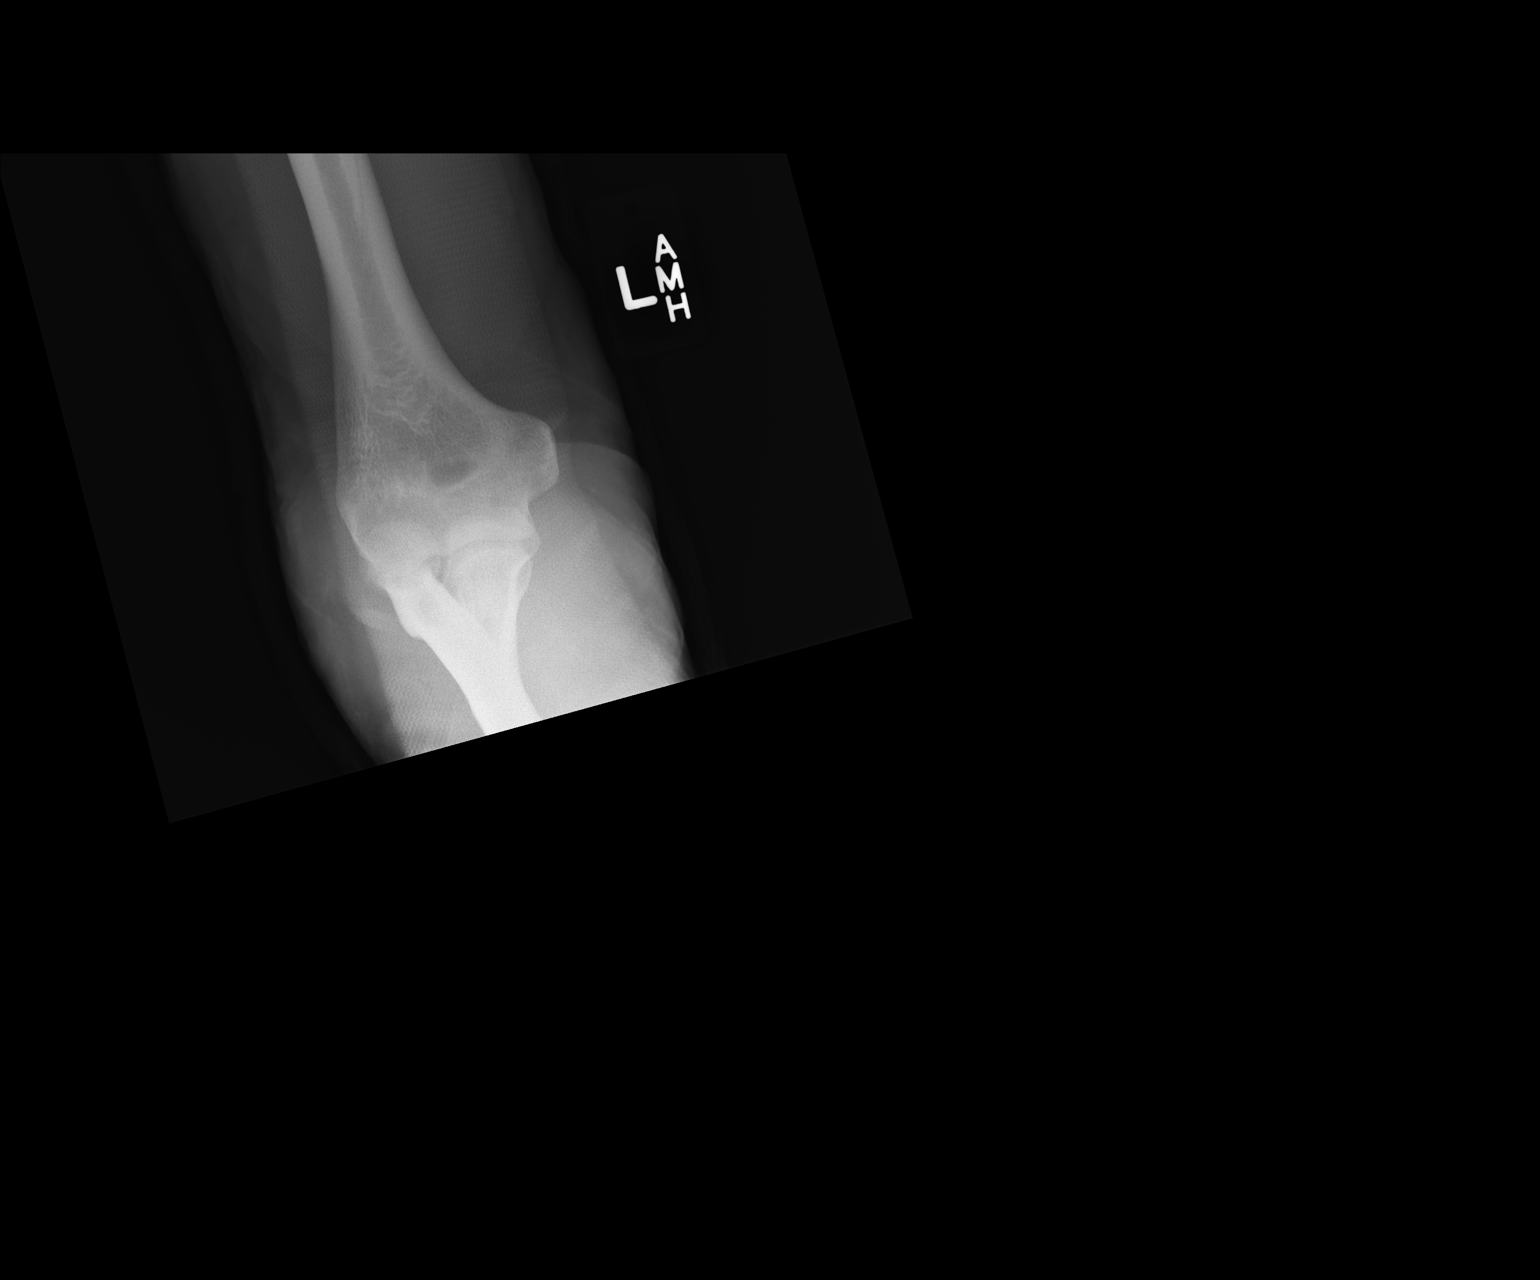

[2 of 2 positions shown; findings below may reference images not displayed]

FINDINGS: Successful reduction of elbow dislocation. No large fracture
fragments are identified. Application a posterior splint material.
IMPRESSION: Successful reduction of posterior elbow dislocation.

## 2015-04-29 ENCOUNTER — Emergency Department (HOSPITAL_COMMUNITY)
Admission: EM | Admit: 2015-04-29 | Discharge: 2015-04-29 | Disposition: A | Discharge status: Home / Self Care | Payer: Self-pay | Attending: Emergency Medicine | Admitting: Emergency Medicine

## 2015-04-29 ENCOUNTER — Encounter (HOSPITAL_COMMUNITY): Payer: Self-pay | Admitting: Family Medicine

## 2015-04-29 DIAGNOSIS — J3489 Other specified disorders of nose and nasal sinuses: Secondary | ICD-10-CM | POA: Insufficient documentation

## 2015-04-29 DIAGNOSIS — L0100 Impetigo, unspecified: Secondary | ICD-10-CM | POA: Insufficient documentation

## 2015-04-29 DIAGNOSIS — Z720 Tobacco use: Secondary | ICD-10-CM | POA: Insufficient documentation

## 2015-04-29 MED ORDER — MUPIROCIN 2 % EX OINT: 1.0000 "application " | TOPICAL_OINTMENT | Freq: Three times a day (TID) | CUTANEOUS | Status: AC

## 2015-04-29 MED ORDER — NAPROXEN 500 MG PO TABS: 500.0000 mg | ORAL_TABLET | Freq: Two times a day (BID) | ORAL | Status: AC

## 2015-04-29 NOTE — Discharge Instructions (Signed)
Follow up with a Primary Care Physician.  Return to the Emergency Department if symptoms change or worsen or you develop a fever.

## 2015-04-29 NOTE — ED Provider Notes (Signed)
CSN: [id number]     Arrival date & time 04/29/15  1801 History  This chart was scribed for non-physician practitioner, Santiago Glad, PA-C, working with Vanetta Mulders, MD by Marica Otter, ED Scribe. This patient was seen in room TR10C/TR10C and the patient's care was started at 8:01 PM.   Chief Complaint  Patient presents with  . Facial Swelling   The history is provided by the patient. No language interpreter was used.   PCP: Default, Provider, MD HPI Comments: Brady Braun is a 44 y.o. male, with PMHx noted below, who presents to the Emergency Department complaining of a atraumatic, worsening bump inside the right nostril with associated pain onset yesterday. Pt reports taking ibuprofen at home without relief. Pt notes he has been recovering from cold like Sx including rhinorrhea for the past 4 days. Pt denies any Hx of similar Sx, fever, or chills.   History reviewed. No pertinent past medical history. Past Surgical History  Procedure Laterality Date  . Cystoscopy  11/16/2011    Procedure: CYSTOSCOPY FLEXIBLE;  Surgeon: Antony Haste, MD;  Location: [facility];  Service: Urology;  Laterality: N/A;   History reviewed. No pertinent family history. Social History  Substance Use Topics  . Smoking status: Current Every Day Smoker    Types: Cigarettes  . Smokeless tobacco: None  . Alcohol Use: Yes     Comment: occ    Review of Systems  Constitutional: Negative for fever and chills.  Musculoskeletal: Positive for arthralgias (bump inside right nostril with associated pain).   Allergies  Review of patient's allergies indicates no known allergies.  Home Medications   Prior to Admission medications   Medication Sig Start Date End Date Taking? Authorizing Provider  diphenhydrAMINE (BENADRYL) 25 MG tablet Take 1 tablet (25 mg total) by mouth every 8 (eight) hours as needed for itching. 03/12/14   Antony Madura, PA-C  EPINEPHrine (EPIPEN) 0.3 mg/0.3 mL IJ SOAJ injection Inject 0.3  mLs (0.3 mg total) into the muscle as needed. 03/12/14   Antony Madura, PA-C  ibuprofen (ADVIL,MOTRIN) 600 MG tablet Take 1 tablet (600 mg total) by mouth every 6 (six) hours as needed. 03/12/14   Antony Madura, PA-C  predniSONE (DELTASONE) 20 MG tablet 3 tabs po day one, then 2 po daily x 4 days 03/12/14   Antony Madura, PA-C   Triage Vitals: BP 122/74 mmHg  Pulse 81  Temp(Src) 98.6 F (37 C)  Resp 18  SpO2 98% Physical Exam  Constitutional: He is oriented to person, place, and time. He appears well-developed and well-nourished.  HENT:  Head: Normocephalic and atraumatic.  Right Ear: Tympanic membrane and ear canal normal.  Left Ear: Tympanic membrane and ear canal normal.  Mouth/Throat: Uvula is midline, oropharynx is clear and moist and mucous membranes are normal.  Small raised area of the lateral aspect of the right lower nare with some honey crusting.   Eyes: EOM are normal.  Neck: Normal range of motion. Neck supple.  Cardiovascular: Normal rate, regular rhythm and normal heart sounds.   Pulmonary/Chest: Effort normal and breath sounds normal.  Abdominal: He exhibits no distension.  Musculoskeletal: Normal range of motion.  Neurological: He is alert and oriented to person, place, and time.  Skin: Skin is warm and dry.  Psychiatric: He has a normal mood and affect.  Nursing note and vitals reviewed.  ED Course  Procedures (including critical care time) DIAGNOSTIC STUDIES: Oxygen Saturation is 98% on RA, nl by my interpretation.  COORDINATION OF CARE: 8:05 PM: Discussed treatment plan which includes topical and oral antibiotics, ENT referral with pt at bedside; patient verbalizes understanding and agrees with treatment plan.  MDM   Final diagnoses:  None  Patient with a small tender lesion of the right nare with honey crusting.  Suspect impetigo.  Nare is patent.  No systemic signs of infection.  Patient treated with Bactroban.  Stable for discharge.  Return precautions  given.  I personally performed the services described in this documentation, which was scribed in my presence. The recorded information has been reviewed and is accurate.   Santiago Glad, PA-C 04/30/15 1550  Vanetta Mulders, MD 05/07/15 [phone]

## 2015-04-29 NOTE — ED Notes (Signed)
Pt here for bump inside of right nares. sts worsening since Saturday,.

## 2015-04-29 NOTE — ED Notes (Signed)
Patient transported to X-ray 

## 2016-03-20 ENCOUNTER — Emergency Department (HOSPITAL_COMMUNITY)
Admission: EM | Admit: 2016-03-20 | Discharge: 2016-03-20 | Disposition: A | Discharge status: Home / Self Care | Payer: Self-pay | Attending: Emergency Medicine | Admitting: Emergency Medicine

## 2016-03-20 ENCOUNTER — Encounter (HOSPITAL_COMMUNITY): Payer: Self-pay | Admitting: Emergency Medicine

## 2016-03-20 DIAGNOSIS — Y9389 Activity, other specified: Secondary | ICD-10-CM | POA: Insufficient documentation

## 2016-03-20 DIAGNOSIS — IMO0002 Reserved for concepts with insufficient information to code with codable children: Secondary | ICD-10-CM

## 2016-03-20 DIAGNOSIS — F17210 Nicotine dependence, cigarettes, uncomplicated: Secondary | ICD-10-CM | POA: Insufficient documentation

## 2016-03-20 DIAGNOSIS — W268XXA Contact with other sharp object(s), not elsewhere classified, initial encounter: Secondary | ICD-10-CM | POA: Insufficient documentation

## 2016-03-20 DIAGNOSIS — Y929 Unspecified place or not applicable: Secondary | ICD-10-CM | POA: Insufficient documentation

## 2016-03-20 DIAGNOSIS — S81812A Laceration without foreign body, left lower leg, initial encounter: Secondary | ICD-10-CM | POA: Insufficient documentation

## 2016-03-20 DIAGNOSIS — Y999 Unspecified external cause status: Secondary | ICD-10-CM | POA: Insufficient documentation

## 2016-03-20 DIAGNOSIS — S81811A Laceration without foreign body, right lower leg, initial encounter: Secondary | ICD-10-CM | POA: Insufficient documentation

## 2016-03-20 MED ORDER — LIDOCAINE-EPINEPHRINE (PF) 2 %-1:200000 IJ SOLN
20.0000 mL | Freq: Once | INTRAMUSCULAR | Status: AC
  Administered 2016-03-20: 20 mL
  Filled 2016-03-20: qty 20

## 2016-03-20 MED ORDER — BACITRACIN ZINC 500 UNIT/GM EX OINT: 1.0000 "application " | TOPICAL_OINTMENT | Freq: Two times a day (BID) | CUTANEOUS | Status: DC
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
## 2016-03-20 NOTE — ED Notes (Signed)
Declined W/C at D/C and was escorted to lobby by RN. 
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
## 2016-03-31 ENCOUNTER — Emergency Department (HOSPITAL_COMMUNITY)
Admission: EM | Admit: 2016-03-31 | Discharge: 2016-03-31 | Disposition: A | Discharge status: Home / Self Care | Payer: Self-pay | Attending: Emergency Medicine | Admitting: Emergency Medicine

## 2016-03-31 DIAGNOSIS — Z4802 Encounter for removal of sutures: Secondary | ICD-10-CM | POA: Insufficient documentation

## 2016-03-31 DIAGNOSIS — F17210 Nicotine dependence, cigarettes, uncomplicated: Secondary | ICD-10-CM | POA: Insufficient documentation

## 2016-03-31 MED ORDER — SULFAMETHOXAZOLE-TRIMETHOPRIM 800-160 MG PO TABS: 1.0000 | ORAL_TABLET | Freq: Two times a day (BID) | ORAL | 0 refills | Status: AC

## 2016-03-31 NOTE — ED Notes (Signed)
Declined W/C at D/C and was escorted to lobby by RN. 

## 2016-03-31 NOTE — ED Triage Notes (Signed)
Declined W/C at D/C and was escorted to lobby by RN. 

## 2016-03-31 NOTE — ED Provider Notes (Signed)
[facility] Provider Note   CSN: [id number] Arrival date & time: 03/31/16  0740     History   Chief Complaint Chief Complaint  Patient presents with  . Suture / Staple Removal    HPI Brady Braun is a 45 y.o. male.  The history is provided by the patient. No language interpreter was used.  Suture / Staple Removal  This is a new problem. The current episode started more than 1 week ago. The problem occurs constantly. The problem has been gradually worsening. Associated symptoms include chest pain. Nothing aggravates the symptoms. Nothing relieves the symptoms. He has tried nothing for the symptoms. The treatment provided no relief.  Pt has sutures in bilat legs.   No past medical history on file.  Patient Active Problem List   Diagnosis Date Noted  . DENTAL PAIN 12/07/2007    Past Surgical History:  Procedure Laterality Date  . CYSTOSCOPY  11/16/2011   Procedure: CYSTOSCOPY FLEXIBLE;  Surgeon: Antony HasteMatthew Ramsey Eskridge, MD;  Location: [facility];  Service: Urology;  Laterality: N/A;       Home Medications    Prior to Admission medications   Medication Sig Start Date End Date Taking? Authorizing Provider  diphenhydrAMINE (BENADRYL) 25 MG tablet Take 1 tablet (25 mg total) by mouth every 8 (eight) hours as needed for itching. 03/12/14   Antony MaduraKelly Humes, PA-C  EPINEPHrine (EPIPEN) 0.3 mg/0.3 mL IJ SOAJ injection Inject 0.3 mLs (0.3 mg total) into the muscle as needed. 03/12/14   Antony MaduraKelly Humes, PA-C  ibuprofen (ADVIL,MOTRIN) 600 MG tablet Take 1 tablet (600 mg total) by mouth every 6 (six) hours as needed. 03/12/14   Antony MaduraKelly Humes, PA-C  mupirocin ointment (BACTROBAN) 2 % Place 1 application into the nose 3 (three) times daily. Use for 7 days 04/29/15   Santiago GladHeather Laisure, PA-C  naproxen (NAPROSYN) 500 MG tablet Take 1 tablet (500 mg total) by mouth 2 (two) times daily. 04/29/15   Heather Laisure, PA-C  predniSONE (DELTASONE) 20 MG tablet 3 tabs po day one, then 2 po daily x 4 days 03/12/14    Antony MaduraKelly Humes, PA-C    Family History No family history on file.  Social History Social History  Substance Use Topics  . Smoking status: Current Every Day Smoker    Types: Cigarettes  . Smokeless tobacco: Not on file  . Alcohol use Yes     Comment: occ     Allergies   Review of patient's allergies indicates no known allergies.   Review of Systems Review of Systems  Cardiovascular: Positive for chest pain.  All other systems reviewed and are negative.    Physical Exam Updated Vital Signs BP 118/79 (BP Location: Left Arm)   Pulse 75   Temp 98.1 F (36.7 C) (Oral)   Resp 12   Ht 5\' 7"  (1.702 m)   Wt 59 kg   SpO2 100%   BMI 20.36 kg/m   Physical Exam  Constitutional: He appears well-developed and well-nourished.  HENT:  Head: Normocephalic and atraumatic.  Eyes: Conjunctivae are normal.  Neck: Neck supple.  Cardiovascular: Normal rate and regular rhythm.   No murmur heard. Pulmonary/Chest: Effort normal and breath sounds normal. No respiratory distress.  Abdominal: Soft. There is no tenderness.  Musculoskeletal: He exhibits no edema.  Lacerations bilat anterior lower legs along shin bones. Healing   Neurological: He is alert.  Skin: Skin is warm and dry.  Psychiatric: He has a normal mood and affect.  Nursing note and vitals  reviewed.    ED Treatments / Results  Labs (all labs ordered are listed, but only abnormal results are displayed) Labs Reviewed - No data to display  EKG  EKG Interpretation None       Radiology No results found.  Procedures Procedures (including critical care time)  Medications Ordered in ED Medications - No data to display   Initial Impression / Assessment and Plan / ED Course  I have reviewed the triage vital signs and the nursing notes.  Pertinent labs & imaging results that were available during my care of the patient were reviewed by me and considered in my medical decision making (see chart for  details).  Clinical Course    Sutures removed by RN  Final Clinical Impressions(s) / ED Diagnoses   Final diagnoses:  Visit for suture removal    New Prescriptions New Prescriptions   No medications on file     Elson AreasLeslie K Sofia, PA-C 03/31/16 [phone]    Marily MemosJason Mesner, MD 03/31/16 [phone]
